# Patient Record
Sex: Male | Born: 1985
Health system: Southern US, Community
[De-identification: ages and names within clinical notes are randomized; demographics above are authoritative.]

## PROBLEM LIST (undated history)

## (undated) DIAGNOSIS — K219 Gastro-esophageal reflux disease without esophagitis: Secondary | ICD-10-CM

## (undated) DIAGNOSIS — B019 Varicella without complication: Secondary | ICD-10-CM

## (undated) DIAGNOSIS — G43909 Migraine, unspecified, not intractable, without status migrainosus: Secondary | ICD-10-CM

## (undated) DIAGNOSIS — K200 Eosinophilic esophagitis: Secondary | ICD-10-CM

## (undated) HISTORY — DX: Gastro-esophageal reflux disease without esophagitis: K21.9

## (undated) HISTORY — DX: Varicella without complication: B01.9

## (undated) HISTORY — DX: Migraine, unspecified, not intractable, without status migrainosus: G43.909

## (undated) HISTORY — DX: Eosinophilic esophagitis: K20.0

---

## 2015-09-07 ENCOUNTER — Ambulatory Visit (INDEPENDENT_AMBULATORY_CARE_PROVIDER_SITE_OTHER): Payer: BLUE CROSS/BLUE SHIELD | Admitting: Primary Care

## 2015-09-07 ENCOUNTER — Encounter: Payer: Self-pay | Admitting: Primary Care

## 2015-09-07 VITALS — BP 116/68 | HR 55 | Temp 98.3°F | Ht 68.5 in | Wt 155.8 lb

## 2015-09-07 DIAGNOSIS — Z0000 Encounter for general adult medical examination without abnormal findings: Secondary | ICD-10-CM | POA: Diagnosis not present

## 2015-09-07 DIAGNOSIS — Z789 Other specified health status: Secondary | ICD-10-CM

## 2015-09-07 DIAGNOSIS — Z23 Encounter for immunization: Secondary | ICD-10-CM

## 2015-09-07 NOTE — Assessment & Plan Note (Signed)
Tdap due, provided today. He follows a healthy lifestyle through diet and routine exercise. Recommended bi-annual eye exam. Exam unremarkable. Declines STD testing. No other labs required aside from varicella immunity confirmation. Will obtain TB skin testing in January 2018. Follow up in 1 year for repeat physical.

## 2015-09-07 NOTE — Progress Notes (Signed)
Subjective:    Patient ID: Steven Reid, male    DOB: [birth year], 30 y.o.   MRN: [id number]  HPI  Steven Reid is a 30 year old male who presents today to establish care and for complete physical.  Immunizations: -Tetanus: Last completed in 1992, due today. -Influenza: Did not complete last season.  Diet: He endorses a healthy diet. Breakfast: Oatmeal, hard boiled eggs Lunch: Lean protein, lentils, beans Dinner: Lean protein, vegetables, beans Snacks: Almonds, walnuts, cheese Desserts: Occasionally Beverages: Water  Exercise: He lifts weights and completes cardio 4-5 times weekly. Eye exam: Completed 3 years ago. No acute changes recently.  Dental exam: Completed semi-annually.     Review of Systems  Constitutional: Negative for unexpected weight change.  HENT: Negative for rhinorrhea.   Respiratory: Negative for cough and shortness of breath.   Cardiovascular: Negative for chest pain.  Gastrointestinal: Negative for diarrhea and constipation.  Genitourinary: Negative for difficulty urinating.  Musculoskeletal: Negative for myalgias and arthralgias.  Skin: Negative for rash.  Allergic/Immunologic: Negative for environmental allergies.  Neurological: Negative for dizziness, numbness and headaches.  Psychiatric/Behavioral:       Denies concerns for anxiety or depression       Past Medical History  Diagnosis Date  . Chicken pox   . Migraines      Social History   Social History  . Marital Status: Married    Spouse Name: N/A  . Number of Children: N/A  . Years of Education: N/A   Occupational History  . Not on file.   Social History Main Topics  . Smoking status: Never Smoker   . Smokeless tobacco: Not on file  . Alcohol Use: No  . Drug Use: No  . Sexual Activity: Not on file   Other Topics Concern  . Not on file   Social History Narrative   Married.   No children.   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   Bachelors degree in DentistCyber Crime.   Enjoys traveling, spending time at home, exercising.    No past surgical history on file.  Family History  Problem Relation Age of Onset  . Hypertension Father     No Known Allergies  No current outpatient prescriptions on file prior to visit.   No current facility-administered medications on file prior to visit.    BP 116/68 mmHg  Pulse 55  Temp(Src) 98.3 F (36.8 C) (Oral)  Ht 5' 8.5" (1.74 m)  Wt 155 lb 12.8 oz (70.67 kg)  BMI 23.34 kg/m2  SpO2 98%    Objective:   Physical Exam  Constitutional: He is oriented to person, place, and time. He appears well-nourished.  HENT:  Right Ear: Tympanic membrane and ear canal normal.  Left Ear: Tympanic membrane and ear canal normal.  Nose: Nose normal. Right sinus exhibits no maxillary sinus tenderness and no frontal sinus tenderness. Left sinus exhibits no maxillary sinus tenderness and no frontal sinus tenderness.  Mouth/Throat: Oropharynx is clear and moist.  Eyes: Conjunctivae and EOM are normal. Pupils are equal, round, and reactive to light.  Neck: Neck supple. Carotid bruit is not present. No thyromegaly present.  Cardiovascular: Normal rate, regular rhythm and normal heart sounds.   Pulmonary/Chest: Effort normal and breath sounds normal. He has no wheezes. He has no rales.  Abdominal: Soft. Bowel sounds are normal. There is no tenderness.  Musculoskeletal: Normal range of motion.  Neurological: He is alert and oriented to person, place, and time. He has normal reflexes. No cranial  nerve deficit.  Skin: Skin is warm and dry.  Psychiatric: He has a normal mood and affect.          Assessment & Plan:

## 2015-09-07 NOTE — Progress Notes (Signed)
Pre visit review using our clinic review tool, if applicable. No additional management support is needed unless otherwise documented below in the visit note. 

## 2015-09-07 NOTE — Patient Instructions (Addendum)
Complete lab work prior to leaving today. I will notify you of your results once received.   Return when you require a TB skin test for school. Just schedule a nurse only visit.  Continue your healthy lifestyle through healthy diet and exercise.   Follow up in 1 year for repeat physical or sooner if needed.  It was a pleasure to meet you today! Please don't hesitate to call me with any questions. Welcome to [facility]!

## 2015-09-07 NOTE — Addendum Note (Signed)
Addended by: Tawnya CrookSAMBATH, Merriel Zinger on: 09/07/2015 11:31 AM   Modules accepted: Orders

## 2015-09-08 ENCOUNTER — Encounter: Payer: Self-pay | Admitting: *Deleted

## 2015-09-08 ENCOUNTER — Telehealth: Payer: Self-pay | Admitting: Primary Care

## 2015-09-08 LAB — VARICELLA ZOSTER ANTIBODY, IGG: VARICELLA IGG: 495.9 {index} — AB (ref <135.00)

## 2015-09-08 NOTE — Telephone Encounter (Signed)
Spoken and notified patient of Kate's comments. Patient verbalized understanding. 

## 2015-09-08 NOTE — Telephone Encounter (Signed)
Pt returned your call.  

## 2016-04-19 ENCOUNTER — Other Ambulatory Visit (INDEPENDENT_AMBULATORY_CARE_PROVIDER_SITE_OTHER): Payer: BLUE CROSS/BLUE SHIELD

## 2016-04-19 ENCOUNTER — Ambulatory Visit: Payer: BLUE CROSS/BLUE SHIELD

## 2016-04-19 DIAGNOSIS — Z119 Encounter for screening for infectious and parasitic diseases, unspecified: Secondary | ICD-10-CM | POA: Diagnosis not present

## 2016-04-22 LAB — QUANTIFERON TB GOLD ASSAY (BLOOD)
INTERFERON GAMMA RELEASE ASSAY: NEGATIVE
MITOGEN-NIL SO: 8.67 [IU]/mL
QUANTIFERON NIL VALUE: 0.02 [IU]/mL
QUANTIFERON TB AG MINUS NIL: 0 [IU]/mL

## 2016-04-24 ENCOUNTER — Encounter: Payer: Self-pay | Admitting: *Deleted

## 2016-07-07 ENCOUNTER — Telehealth: Payer: Self-pay | Admitting: Primary Care

## 2016-07-07 NOTE — Telephone Encounter (Signed)
Patient walked in to drop off forms for Immunizations for School.  Placed in script tower.  Thank you,  -LL

## 2016-07-07 NOTE — Telephone Encounter (Signed)
Spoke to pt. Form filled out and ready for pickup. It was asking for a Flu vaccine from Sept to May. Advised him we did not have any. 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

## 2016-11-16 ENCOUNTER — Telehealth: Payer: Self-pay | Admitting: Primary Care

## 2016-11-16 DIAGNOSIS — Z0283 Encounter for blood-alcohol and blood-drug test: Secondary | ICD-10-CM

## 2016-11-16 NOTE — Telephone Encounter (Signed)
I'm not sure, Camelia Eng do you know if this is something we offer or will he need to go to an urgent care?

## 2016-11-16 NOTE — Telephone Encounter (Signed)
Caller Name:Steven Reid  Relationship to Patient:self Best number:[phone] Pharmacy:  Reason for call: pt needs a 5 panel drug screen for for school.  He did not get an order.  Is this something we offer? Also, he needs a flu shot.

## 2016-11-16 NOTE — Telephone Encounter (Signed)
Patient scheduled and order has been placed

## 2016-11-23 ENCOUNTER — Other Ambulatory Visit: Payer: BLUE CROSS/BLUE SHIELD

## 2016-11-23 ENCOUNTER — Ambulatory Visit (INDEPENDENT_AMBULATORY_CARE_PROVIDER_SITE_OTHER): Payer: BLUE CROSS/BLUE SHIELD

## 2016-11-23 DIAGNOSIS — Z23 Encounter for immunization: Secondary | ICD-10-CM | POA: Diagnosis not present

## 2016-11-23 DIAGNOSIS — Z0283 Encounter for blood-alcohol and blood-drug test: Secondary | ICD-10-CM

## 2016-11-24 ENCOUNTER — Encounter: Payer: Self-pay | Admitting: *Deleted

## 2016-11-24 LAB — DRUG SCREEN, 5 PANEL, UR
AMPHETAMINES, URINE: NEGATIVE ng/mL
CANNABINOID QUANT UR: NEGATIVE ng/mL
Cocaine (Metab.): NEGATIVE ng/mL
OPIATE QUANTITATIVE URINE: NEGATIVE ng/mL
PCP QUANT UR: NEGATIVE ng/mL

## 2017-07-23 ENCOUNTER — Encounter: Payer: Self-pay | Admitting: Primary Care

## 2017-07-23 ENCOUNTER — Ambulatory Visit: Payer: BLUE CROSS/BLUE SHIELD | Admitting: Primary Care

## 2017-07-23 ENCOUNTER — Ambulatory Visit (INDEPENDENT_AMBULATORY_CARE_PROVIDER_SITE_OTHER)
Admission: RE | Admit: 2017-07-23 | Discharge: 2017-07-23 | Disposition: A | Discharge status: Home / Self Care | Payer: BLUE CROSS/BLUE SHIELD | Source: Ambulatory Visit | Attending: Primary Care | Admitting: Primary Care

## 2017-07-23 VITALS — BP 106/66 | HR 62 | Temp 98.2°F | Ht 68.5 in | Wt 160.0 lb

## 2017-07-23 DIAGNOSIS — M545 Low back pain: Secondary | ICD-10-CM | POA: Diagnosis not present

## 2017-07-23 DIAGNOSIS — G8929 Other chronic pain: Secondary | ICD-10-CM

## 2017-07-23 NOTE — Progress Notes (Signed)
Subjective:    Patient ID: Steven Reid, male    DOB: 1985/10/12, 32 y.o.   MRN: [id number]  HPI  Steven Reid is a 32 year old male who presents today with a chief complaint of back pain. His pain is located to the right lower back with most pain being around PSIS joint region.    In 07-16-2012 was doing dead lifts in the gym, noticed a sudden popping noise with immediate muscle tension/tightness/pain to the right lower back. His symptoms were more severe during that time, did notice some improvement but over the years has not noticed resolve.  Pain is present when laying on his back and sitting for prolonged periods of time, especially when driving for more than 40 minutes in the car.   He saw a chiropractor in 07/17/2015, underwent imaging of the lumbar spine which showed shifting in L3-L5. He completed adjustments for one month, felt better but no resolve.  Symptoms now including feeling muscle tightness/pulling to the right lower back. He denies numbness/tingling, pain to lower extremities, trauma/injury, weakness. He exercises frequently, has worked on stretching and core strengthening without much improvement. He did undergo dry needling in the past which caused increased discomfort. He doesn't take anything OTC or RX for his symptoms.   Review of Systems  Musculoskeletal: Positive for back pain.  Skin: Negative for color change.  Neurological: Negative for weakness and numbness.       Past Medical History:  Diagnosis Date  . Chicken pox   . Migraines      Social History   Socioeconomic History  . Marital status: Married    Spouse name: Not on file  . Number of children: Not on file  . Years of education: Not on file  . Highest education level: Not on file  Occupational History  . Not on file  Social Needs  . Financial resource strain: Not on file  . Food insecurity:    Worry: Not on file    Inability: Not on file  . Transportation needs:    Medical: Not on file   Non-medical: Not on file  Tobacco Use  . Smoking status: Never Smoker  . Smokeless tobacco: Never Used  Substance and Sexual Activity  . Alcohol use: No    Alcohol/week: 0.0 oz  . Drug use: No  . Sexual activity: Not on file  Lifestyle  . Physical activity:    Days per week: Not on file    Minutes per session: Not on file  . Stress: Not on file  Relationships  . Social connections:    Talks on phone: Not on file    Gets together: Not on file    Attends religious service: Not on file    Active member of club or organization: Not on file    Attends meetings of clubs or organizations: Not on file    Relationship status: Not on file  . Intimate partner violence:    Fear of current or ex partner: Not on file    Emotionally abused: Not on file    Physically abused: Not on file    Forced sexual activity: Not on file  Other Topics Concern  . Not on file  Social History Narrative   Married.   No children.   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   Bachelors degree in Dentist.   Enjoys traveling, spending time at home, exercising.     Family History  Problem Relation Age of Onset  .  Hypertension Father     No Known Allergies  No current outpatient medications on file prior to visit.   No current facility-administered medications on file prior to visit.     BP 106/66   Pulse 62   Temp 98.2 F (36.8 C) (Oral)   Ht 5' 8.5" (1.74 m)   Wt 160 lb (72.6 kg)   SpO2 98%   BMI 23.97 kg/m    Objective:   Physical Exam  Constitutional: He appears well-nourished.  Cardiovascular: Normal rate and regular rhythm.  Respiratory: Effort normal and breath sounds normal.  Musculoskeletal:       Lumbar back: He exhibits tenderness and pain. He exhibits normal range of motion, no bony tenderness and no deformity.       Back:  Negative straight leg raise bilaterally. Tenderness to right lower PSIS region with moderate palpation. 5/5 strength to bilateral lower extremities.     Skin: Skin is warm and dry.           Assessment & Plan:

## 2017-07-23 NOTE — Assessment & Plan Note (Signed)
Chronic to right lower back since 2014, no complete resolve. Exam today overall without abnormality, suspect MSK involvement.Steven Reid. 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  Will start with plain films of the lumbar spine. Will also send to sports medicine for further evaluation. No alarm signs.

## 2017-07-23 NOTE — Patient Instructions (Signed)
Complete xray(s) prior to leaving today. I will notify you of your results once received.  Continue regular stretching and exercise as discussed.  Schedule an appointment with Dr. Patsy Lageropland at your convenience.  It was a pleasure to see you today!

## 2017-07-26 ENCOUNTER — Ambulatory Visit: Payer: BLUE CROSS/BLUE SHIELD | Admitting: Family Medicine

## 2017-07-26 ENCOUNTER — Encounter: Payer: Self-pay | Admitting: Family Medicine

## 2017-07-26 VITALS — BP 110/62 | HR 66 | Temp 98.6°F | Ht 68.5 in | Wt 161.8 lb

## 2017-07-26 DIAGNOSIS — M533 Sacrococcygeal disorders, not elsewhere classified: Secondary | ICD-10-CM | POA: Diagnosis not present

## 2017-07-26 NOTE — Progress Notes (Signed)
Dr. Karleen Hampshire T. Darcey Demma, MD, CAQ Sports Medicine Primary Care and Sports Medicine [address] [place], [address] Phone: [phone] Fax: [phone]  07/26/2017  Patient: Steven Reid, MRN: [id number], DOB: 1985-07-22, 32 y.o.  Primary Physician:  Doreene Nest, NP   Chief Complaint  Patient presents with  . Back Pain   Subjective:   Steven Reid is a 32 y.o. very pleasant male patient who presents with the following:  Pop low back when lowering some deadlift weight in 2014.  He felt this and heard this and it was localized into the region of his SI joint on the right.  He has been having some pain off and on with this over the last for 5 years.  He has tried to alter his program at the gym.  He no longer does back squats or dead lifts.  He is able to front squat without difficulty.  He is not doing any Olympic lifts.  He did try to do some hang cleans, but this is bothered it quite a bit.  Never had any back pain before then. Now everything is super tight and sore. Sometimes will use a tiger balm. No meds. 5-6 years later. Laying down, 20 mins will start to hurt. Sitting is the worst.   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   Recently, he has been doing some SI maneuvers that he learned in school, and he thinks that this is actually been making a difference.  Past Medical History, Surgical History, Social History, Family History, Problem List, Medications, and Allergies have been reviewed and updated if relevant.  Patient Active Problem List   Diagnosis Date Noted  . Chronic right-sided low back pain without sciatica 07/23/2017  . Preventative health care 09/07/2015    Past Medical History:  Diagnosis Date  . Chicken pox   . Migraines     History reviewed. No pertinent surgical history.  Social History   Socioeconomic History  . Marital status: Married    Spouse name: Not on file  . Number of children: Not on file  . Years of education: Not on file  . Highest  education level: Not on file  Occupational History  . Not on file  Social Needs  . Financial resource strain: Not on file  . Food insecurity:    Worry: Not on file    Inability: Not on file  . Transportation needs:    Medical: Not on file    Non-medical: Not on file  Tobacco Use  . Smoking status: Never Smoker  . Smokeless tobacco: Never Used  Substance and Sexual Activity  . Alcohol use: No    Alcohol/week: 0.0 oz  . Drug use: No  . Sexual activity: Not on file  Lifestyle  . Physical activity:    Days per week: Not on file    Minutes per session: Not on file  . Stress: Not on file  Relationships  . Social connections:    Talks on phone: Not on file    Gets together: Not on file    Attends religious service: Not on file    Active member of club or organization: Not on file    Attends meetings of clubs or organizations: Not on file    Relationship status: Not on file  . Intimate partner violence:    Fear of current or ex partner: Not on file    Emotionally abused: Not on file    Physically abused: Not on file  Forced sexual activity: Not on file  Other Topics Concern  . Not on file  Social History Narrative   Married.   No children.   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   Bachelors degree in DentistCyber Crime.   Enjoys traveling, spending time at home, exercising.    Family History  Problem Relation Age of Onset  . Hypertension Father     No Known Allergies  Medication list reviewed and updated in full in [facility].  GEN: No fevers, chills. Nontoxic. Primarily MSK c/o today. MSK: Detailed in the HPI GI: tolerating PO intake without difficulty Neuro: No numbness, parasthesias, or tingling associated. Otherwise the pertinent positives of the ROS are noted above.   Objective:   BP 110/62   Pulse 66   Temp 98.6 F (37 C) (Oral)   Ht 5' 8.5" (1.74 m)   Wt 161 lb 12 oz (73.4 kg)   BMI 24.24 kg/m    GEN: Well-developed,well-nourished,in no acute  distress; alert,appropriate and cooperative throughout examination HEENT: Normocephalic and atraumatic without obvious abnormalities. Ears, externally no deformities PULM: Breathing comfortably in no respiratory distress EXT: No clubbing, cyanosis, or edema PSYCH: Normally interactive. Cooperative during the interview. Pleasant. Friendly and conversant. Not anxious or depressed appearing. Normal, full affect.  Range of motion at  the waist: Flexion, extension, lateral bending and rotation: Grossly full range of motion with some modest discomfort with extension.  No echymosis or edema Rises to examination table with mild difficulty Gait: minimally antalgic  Inspection/Deformity: N Paraspinus Tenderness: Minimal to none.  B Ankle Dorsiflexion (L5,4): 5/5 B Great Toe Dorsiflexion (L5,4): 5/5 Heel Walk (L5): WNL Toe Walk (S1): WNL Rise/Squat (L4): WNL, mild pain  SENSORY B Medial Foot (L4): WNL B Dorsum (L5): WNL B Lateral (S1): WNL Light Touch: WNL Pinprick: WNL  REFLEXES Knee (L4): 2+ Ankle (S1): 2+  B SLR, seated: neg B SLR, supine: neg B FABER: neg B Reverse FABER: neg B Greater Troch: NT B Log Roll: neg B Stork: NT B Sciatic Notch: TTP on the R  Radiology: Dg Lumbar Spine Complete  Result Date: 07/23/2017 CLINICAL DATA:  Chronic back pain since 2014, increased pain with sitting and lying down, no radiculopathy, no history of trauma EXAM: LUMBAR SPINE - COMPLETE 4+ VIEW COMPARISON:  None FINDINGS: Osseous mineralization normal for technique. Five non-rib-bearing lumbar vertebra. Vertebral body and disc space heights maintained. No fracture, subluxation, or bone destruction. No spondylolysis. SI joints preserved. IMPRESSION: Normal exam. Electronically Signed   By: Ulyses SouthwardMark  Boles M.D.   On: 07/23/2017 15:24    Assessment and Plan:   SI (sacroiliac) joint dysfunction  >25 minutes spent in face to face time with patient, >50% spent in counselling or coordination of care    Isolates to R SI joint, and historically sounds as if a R SI joint injury.  A rehabilitation program was given to the patient emphasizing [facility], increasing overall hip and piriformis flexibility, and hip flexor and abductor strength.  I think that he is already doing a good job with his rehab with what he has changed recently.  I think he should basically only limit himself in the gym based on pain.  Part of this was given from P/I section and another handout custom made for patient.  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   This condition does  remarkably well in the medical literature with manipulation, so chiropractic maneuvers would be a very reasonable idea in this case.  Follow-up: prn only  Signed,  Granger Chui T. Sharonica Kraszewski, MD   Allergies as of 07/26/2017   No Known Allergies     Medication List    as of 07/26/2017 11:59 PM   You have not been prescribed any medications.

## 2017-07-26 NOTE — Patient Instructions (Signed)
Sacroiliac Joint Mobilization and Rehab 1. Work on pretzel stretching, shoulder back and leg draped in front. 3-5 sets, 30 sec.. 2. hip abductor rotations. standing, hip flexion and rotation outward then inward. 3 sets, 15 reps. when can do comfortably, add ankle weights starting at 2 pounds.  3. cross over stretching - shoulder back to ground, same side leg crossover. 3-5 sets for 30 min..  4. rolling up and back knees to chest and rocking. 5. sacral tilt - 5 sets, hold for 5-10 seconds  6. Sink stretch

## 2017-11-15 ENCOUNTER — Encounter: Payer: Self-pay | Admitting: Family Medicine

## 2017-11-15 ENCOUNTER — Ambulatory Visit: Payer: BLUE CROSS/BLUE SHIELD | Admitting: Family Medicine

## 2017-11-15 VITALS — BP 100/64 | HR 83 | Temp 98.9°F | Ht 68.5 in | Wt 160.2 lb

## 2017-11-15 DIAGNOSIS — B349 Viral infection, unspecified: Secondary | ICD-10-CM | POA: Diagnosis not present

## 2017-11-15 NOTE — Progress Notes (Signed)
Dr. Karleen Hampshire T. Jonh Mcqueary, MD, CAQ Sports Medicine Primary Care and Sports Medicine [address] [place], [address] Phone: [phone] Fax: [phone]  11/15/2017  Patient: Steven Reid, MRN: [id number], DOB: 12-28-1985, 32 y.o.  Primary Physician:  Doreene Nest, NP   Chief Complaint  Patient presents with  . Cough    with chest congestion x 1 week-getting worse  . Nasal Congestion   Subjective:   This 32 y.o. male patient presents with runny nose, sneezing, cough, malaise and minimal / low-grade fever .  Deep cough x 7 d, non-smoker.   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   The patent denies sore throat as the primary complaint. Denies sthortness of breath/wheezing, high fever, chest pain, rhinits for more than 14 days, significant myalgia, otalgia, facial pain, abdominal pain, changes in bowel or bladder.  PMH, PHS, Allergies, Problem List, Medications, Family History, and Social History have all been reviewed.  Patient Active Problem List   Diagnosis Date Noted  . Chronic right-sided low back pain without sciatica 07/23/2017  . Preventative health care 09/07/2015    Past Medical History:  Diagnosis Date  . Chicken pox   . Migraines     History reviewed. No pertinent surgical history.  Social History   Socioeconomic History  . Marital status: Married    Spouse name: Not on file  . Number of children: Not on file  . Years of education: Not on file  . Highest education level: Not on file  Occupational History  . Not on file  Social Needs  . Financial resource strain: Not on file  . Food insecurity:    Worry: Not on file    Inability: Not on file  . Transportation needs:    Medical: Not on file    Non-medical: Not on file  Tobacco Use  . Smoking status: Never Smoker  . Smokeless tobacco: Never Used  Substance and Sexual Activity  . Alcohol use: No    Alcohol/week: 0.0 standard drinks  . Drug use: No  .  Sexual activity: Not on file  Lifestyle  . Physical activity:    Days per week: Not on file    Minutes per session: Not on file  . Stress: Not on file  Relationships  . Social connections:    Talks on phone: Not on file    Gets together: Not on file    Attends religious service: Not on file    Active member of club or organization: Not on file    Attends meetings of clubs or organizations: Not on file    Relationship status: Not on file  . Intimate partner violence:    Fear of current or ex partner: Not on file    Emotionally abused: Not on file    Physically abused: Not on file    Forced sexual activity: Not on file  Other Topics Concern  . Not on file  Social History Narrative   Married.   No children.   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   Bachelors degree in Dentist.   Enjoys traveling, spending time at home, exercising.    Family History  Problem Relation Age of Onset  . Hypertension Father     No Known Allergies  Medication list reviewed and updated in full in [facility].  ROS as above, eating and drinking - tolerating PO. Urinating normally. No excessive vomitting or diarrhea. O/w as above.  Objective:   Blood pressure 100/64, pulse 83, temperature 98.9 F (37.2 C), temperature source Oral, height 5' 8.5" (1.74 m), weight 160 lb 4 oz (72.7 kg), SpO2 96 %.  GEN: WDWN, Non-toxic, Atraumatic, normocephalic. A and O x 3. HEENT: Oropharynx clear without exudate, MMM, no significant LAD, mild rhinnorhea Ears: TM clear, COL visualized with good landmarks CV: RRR, no m/g/r. Pulm: CTA B, no wheezes, rhonchi, or crackles, normal respiratory effort. EXT: no c/c/e Psych: well oriented, neither depressed nor anxious in appearance  Objective Data:  Assessment and Plan:   Viral syndrome  Supportive care reviewed with patient. Looks good. Reassurance. OTC cough meds prn  Follow-up: No follow-ups on file.   Signed,  Elpidio Galea. Olanna Percifield,  MD   Patient's Medications   No medications on file

## 2017-12-18 ENCOUNTER — Ambulatory Visit: Payer: BLUE CROSS/BLUE SHIELD | Admitting: Primary Care

## 2017-12-18 ENCOUNTER — Ambulatory Visit (INDEPENDENT_AMBULATORY_CARE_PROVIDER_SITE_OTHER)
Admission: RE | Admit: 2017-12-18 | Discharge: 2017-12-18 | Disposition: A | Discharge status: Home / Self Care | Payer: BLUE CROSS/BLUE SHIELD | Source: Ambulatory Visit | Attending: Primary Care | Admitting: Primary Care

## 2017-12-18 ENCOUNTER — Encounter: Payer: Self-pay | Admitting: Primary Care

## 2017-12-18 VITALS — BP 112/78 | HR 51 | Temp 98.4°F | Resp 18 | Ht 68.5 in | Wt 162.0 lb

## 2017-12-18 DIAGNOSIS — R05 Cough: Secondary | ICD-10-CM

## 2017-12-18 DIAGNOSIS — R059 Cough, unspecified: Secondary | ICD-10-CM

## 2017-12-18 MED ORDER — DOXYCYCLINE HYCLATE 100 MG PO TABS: 100.0000 mg | ORAL_TABLET | Freq: Two times a day (BID) | ORAL | 0 refills | Status: DC

## 2017-12-18 MED ORDER — BENZONATATE 200 MG PO CAPS: 200.0000 mg | ORAL_CAPSULE | Freq: Three times a day (TID) | ORAL | 0 refills | Status: DC | PRN

## 2017-12-18 NOTE — Progress Notes (Signed)
Subjective:    Patient ID: Steven Reid, male    DOB: 1986/01/30, 32 y.o.   MRN: [id number]  HPI  Mr. Ponti is a 32 year old male who presents today with a chief complaint of rib pain and cough.  He was last evaluated on 11/15/17 by Dr. Patsy Lager with a one week history of cough, sneezing, rhinorrhea, low grade fevers. 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 He was diagnosed with viral syndrome and provided with supportive care.   Since his last visit he's noticed left anterior-lateral rib pain just below the pectoris. He continues to experience productive cough. Overall his symptoms have waxed and waned, some days he'll feel better and other days he feels worse. He has not recovered.  He's not taking anything OTC for his cough within the last several weeks.   Review of Systems  Constitutional: Positive for fatigue. Negative for fever.  HENT: Positive for congestion. Negative for ear pain and sinus pressure.   Respiratory: Positive for cough. Negative for shortness of breath.   Musculoskeletal:       Chest wall pain       Past Medical History:  Diagnosis Date  . Chicken pox   . Migraines      Social History   Socioeconomic History  . Marital status: Married    Spouse name: Not on file  . Number of children: Not on file  . Years of education: Not on file  . Highest education level: Not on file  Occupational History  . Not on file  Social Needs  . Financial resource strain: Not on file  . Food insecurity:    Worry: Not on file    Inability: Not on file  . Transportation needs:    Medical: Not on file    Non-medical: Not on file  Tobacco Use  . Smoking status: Never Smoker  . Smokeless tobacco: Never Used  Substance and Sexual Activity  . Alcohol use: No    Alcohol/week: 0.0 standard drinks  . Drug use: No  . Sexual activity: Not on file  Lifestyle  . Physical activity:    Days per week: Not on file    Minutes per session: Not on  file  . Stress: Not on file  Relationships  . Social connections:    Talks on phone: Not on file    Gets together: Not on file    Attends religious service: Not on file    Active member of club or organization: Not on file    Attends meetings of clubs or organizations: Not on file    Relationship status: Not on file  . Intimate partner violence:    Fear of current or ex partner: Not on file    Emotionally abused: Not on file    Physically abused: Not on file    Forced sexual activity: Not on file  Other Topics Concern  . Not on file  Social History Narrative   Married.   No children.   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   Bachelors degree in Dentist.   Enjoys traveling, spending time at home, exercising.    No past surgical history on file.  Family History  Problem Relation Age of Onset  . Hypertension Father     No Known Allergies  No current outpatient medications on file prior to visit.   No current facility-administered medications on file prior to visit.     BP 112/78 (BP  Location: Left Arm, Patient Position: Sitting, Cuff Size: Normal)   Pulse (!) 51   Temp 98.4 F (36.9 C) (Oral)   Resp 18   Ht 5' 8.5" (1.74 m)   Wt 162 lb (73.5 kg)   SpO2 99%   BMI 24.27 kg/m    Objective:   Physical Exam  Constitutional: He appears well-nourished. He does not appear ill.  HENT:  Right Ear: Tympanic membrane and ear canal normal.  Left Ear: Tympanic membrane and ear canal normal.  Nose: No mucosal edema. Right sinus exhibits no maxillary sinus tenderness and no frontal sinus tenderness. Left sinus exhibits no maxillary sinus tenderness and no frontal sinus tenderness.  Mouth/Throat: Oropharynx is clear and moist.  Neck: Neck supple.  Cardiovascular: Normal rate and regular rhythm.  Respiratory: Effort normal and breath sounds normal. He has no wheezes.  Mildly congested cough during exam. Slightly diminished sounds to right lower lobe.  Skin: Skin is warm and  dry.           Assessment & Plan:  Acute Bronchitis:  Cough, congestion, fevers, fatigue x 5-6 weeks.  Exam today with some evidence of diminshed sounds to right lower fields. Chest xray pending to rule out CAP. Suspect chest wall pain secondary to inflammation from coughing, discussed treatment with Ibuprofen.  Given lack of improvement for this otherwise healthy young man, and also based off of presentation and exam, will treat for presumed bacterial cause. Rx for Doxycycline 7 day course and benzonatate capsule sent to pharmacy. Fluids, rest, follow up PRN.  Doreene Nest, NP

## 2017-12-18 NOTE — Patient Instructions (Addendum)
Start Doxycycline antibiotic for the infection. Take 1 tablet by mouth twice daily for 7 days.  You may take Benzonatate capsules for cough. Take 1 capsule by mouth three times daily as needed for cough.  Complete xray(s) prior to leaving today. I will notify you of your results once received.  Make sure to drink plenty of fluids and rest.   It was a pleasure to see you today!

## 2017-12-21 ENCOUNTER — Telehealth: Payer: Self-pay | Admitting: Primary Care

## 2017-12-21 NOTE — Telephone Encounter (Signed)
Per DPR, left detail message of Steven Reid comments for patient regarding x-ray

## 2017-12-21 NOTE — Telephone Encounter (Signed)
Pt want to know results of his xray he had done on 10.29.19. Please advise

## 2018-01-22 ENCOUNTER — Encounter: Payer: Self-pay | Admitting: Internal Medicine

## 2018-01-22 ENCOUNTER — Ambulatory Visit: Payer: BLUE CROSS/BLUE SHIELD | Admitting: Internal Medicine

## 2018-01-22 VITALS — BP 110/72 | HR 54 | Temp 98.0°F | Wt 170.0 lb

## 2018-01-22 DIAGNOSIS — J3089 Other allergic rhinitis: Secondary | ICD-10-CM

## 2018-01-22 NOTE — Patient Instructions (Signed)

## 2018-01-22 NOTE — Progress Notes (Signed)
HPI  Pt presents to the clinic today with c/o facial pressure, nasal congestion, sore throat and cough. He reports this started 2 days ago. He is blowing green mucous out of his nose. He denies difficulty swallowing. The cough is productive of green mucous. He denies fever, chills or body aches. He has tried Flonase with some relief. He has no history of allergies. He has had sick contacts.  Review of Systems     Past Medical History:  Diagnosis Date  . Chicken pox   . Migraines     Family History  Problem Relation Age of Onset  . Hypertension Father     Social History   Socioeconomic History  . Marital status: Married    Spouse name: Not on file  . Number of children: Not on file  . Years of education: Not on file  . Highest education level: Not on file  Occupational History  . Not on file  Social Needs  . Financial resource strain: Not on file  . Food insecurity:    Worry: Not on file    Inability: Not on file  . Transportation needs:    Medical: Not on file    Non-medical: Not on file  Tobacco Use  . Smoking status: Never Smoker  . Smokeless tobacco: Never Used  Substance and Sexual Activity  . Alcohol use: No    Alcohol/week: 0.0 standard drinks  . Drug use: No  . Sexual activity: Not on file  Lifestyle  . Physical activity:    Days per week: Not on file    Minutes per session: Not on file  . Stress: Not on file  Relationships  . Social connections:    Talks on phone: Not on file    Gets together: Not on file    Attends religious service: Not on file    Active member of club or organization: Not on file    Attends meetings of clubs or organizations: Not on file    Relationship status: Not on file  . Intimate partner violence:    Fear of current or ex partner: Not on file    Emotionally abused: Not on file    Physically abused: Not on file    Forced sexual activity: Not on file  Other Topics Concern  . Not on file  Social History Narrative   Married.   No children.   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   Bachelors degree in Dentist.   Enjoys traveling, spending time at home, exercising.    No Known Allergies   Constitutional: Denies headache, fatigue, fever or abrupt weight changes.  HEENT:  Positive facial pain, nasal congestion and sore throat. Denies eye redness, ear pain, ringing in the ears, wax buildup, runny nose or bloody nose. Respiratory: Positive cough. Denies difficulty breathing or shortness of breath.  Cardiovascular: Denies chest pain, chest tightness, palpitations or swelling in the hands or feet.   No other specific complaints in a complete review of systems (except as listed in HPI above).  Objective:   BP 110/72   Pulse (!) 54   Temp 98 F (36.7 C) (Oral)   Wt 170 lb (77.1 kg)   SpO2 98%   BMI 25.47 kg/m   General: Appears his stated age, well developed, well nourished in NAD. HEENT: Head: normal shape and size, no sinus tenderness noted; Ears: Tm's gray and intact, normal light reflex; Nose: mucosa boggy and moist, turbinates swollen; Throat/Mouth: + PND. Teeth present, mucosa  erythematous and moist, no exudate noted, no lesions or ulcerations noted.  Neck:  No adenopathy noted.  Cardiovascular: Normal rate and rhythm. S1,S2 noted.  No murmur, rubs or gallops noted.  Pulmonary/Chest: Normal effort and positive vesicular breath sounds. No respiratory distress. No wheezes, rales or ronchi noted.       Assessment & Plan:   Allergic Rhinitis  Flonase 2 sprays each nostril for 3 days and then daily as needed. Zyrtec 10 mg OTC daily x 1 week  RTC as needed or if symptoms persist. Nicki Reaperegina Therman Hughlett, NP

## 2018-02-28 ENCOUNTER — Telehealth: Payer: Self-pay | Admitting: Primary Care

## 2018-02-28 NOTE — Telephone Encounter (Signed)
Spoke to pt. He will look for a provider.

## 2018-02-28 NOTE — Telephone Encounter (Signed)
Pt called office to see if he can get fertility testing done through [facility]. Can orders be sent to [facility] to get it done or would he need to be referred out. Please advise

## 2018-02-28 NOTE — Telephone Encounter (Signed)
Please notify patient that we don't have experience with fertility testing, this is typically done through a Urologist (sperm evaluation) or fertility clinic.

## 2018-03-19 DIAGNOSIS — N469 Male infertility, unspecified: Secondary | ICD-10-CM | POA: Diagnosis not present

## 2018-07-23 DIAGNOSIS — R208 Other disturbances of skin sensation: Secondary | ICD-10-CM | POA: Diagnosis not present

## 2018-07-23 DIAGNOSIS — L7211 Pilar cyst: Secondary | ICD-10-CM | POA: Diagnosis not present

## 2018-08-15 DIAGNOSIS — J3489 Other specified disorders of nose and nasal sinuses: Secondary | ICD-10-CM | POA: Diagnosis not present

## 2018-08-15 DIAGNOSIS — J301 Allergic rhinitis due to pollen: Secondary | ICD-10-CM | POA: Diagnosis not present

## 2018-08-15 DIAGNOSIS — R1314 Dysphagia, pharyngoesophageal phase: Secondary | ICD-10-CM | POA: Diagnosis not present

## 2018-08-15 DIAGNOSIS — J305 Allergic rhinitis due to food: Secondary | ICD-10-CM | POA: Diagnosis not present

## 2018-08-21 ENCOUNTER — Ambulatory Visit (INDEPENDENT_AMBULATORY_CARE_PROVIDER_SITE_OTHER): Payer: BC Managed Care – PPO | Admitting: Podiatry

## 2018-08-21 ENCOUNTER — Encounter: Payer: Self-pay | Admitting: Podiatry

## 2018-08-21 ENCOUNTER — Other Ambulatory Visit: Payer: Self-pay

## 2018-08-21 VITALS — Temp 97.7°F

## 2018-08-21 DIAGNOSIS — B351 Tinea unguium: Secondary | ICD-10-CM | POA: Diagnosis not present

## 2018-08-21 DIAGNOSIS — L601 Onycholysis: Secondary | ICD-10-CM | POA: Diagnosis not present

## 2018-08-21 DIAGNOSIS — L603 Nail dystrophy: Secondary | ICD-10-CM

## 2018-08-21 NOTE — Progress Notes (Signed)
  Subjective:  Patient ID: Steven Reid, male    DOB: [birth year],  MRN: [id number] HPI Chief Complaint  Patient presents with  . Nail Problem    i have some thick toenails on both big toes     33 y.o. male presents with the above complaint.   ROS: Denies fever chills nausea vomiting muscle aches pains calf pain back pain chest pain shortness of breath.  Past Medical History:  Diagnosis Date  . Chicken pox   . Migraines    History reviewed. No pertinent surgical history. No current outpatient medications on file.  No Known Allergies Review of Systems Objective:   Vitals:   08/21/18 1024  Temp: 97.7 F (36.5 C)    General: Well developed, nourished, in no acute distress, alert and oriented x3   Dermatological: Skin is warm, dry and supple bilateral. Nails x 10 are well maintained; remaining integument appears unremarkable at this time. There are no open sores, no preulcerative lesions, no rash or signs of infection present.  Hallux nails bilateral are thickened discolored with subungual debris and splitting.  Vascular: Dorsalis Pedis artery and Posterior Tibial artery pedal pulses are 2/4 bilateral with immedate capillary fill time. Pedal hair growth present. No varicosities and no lower extremity edema present bilateral.   Neruologic: Grossly intact via light touch bilateral. Vibratory intact via tuning fork bilateral. Protective threshold with Semmes Wienstein monofilament intact to all pedal sites bilateral. Patellar and Achilles deep tendon reflexes 2+ bilateral. No Babinski or clonus noted bilateral.   Musculoskeletal: No gross boney pedal deformities bilateral. No pain, crepitus, or limitation noted with foot and ankle range of motion bilateral. Muscular strength 5/5 in all groups tested bilateral.  Gait: Unassisted, Nonantalgic.    Radiographs:  None taken  Assessment & Plan:   Assessment: Nail dystrophy hallux bilateral.  Plan: Took skin and nail sample to be  sent for pathologic evaluation notify him of the results.     Elleigh Cassetta T. [place], [place]

## 2018-08-22 ENCOUNTER — Other Ambulatory Visit: Payer: Self-pay | Admitting: Unknown Physician Specialty

## 2018-08-22 DIAGNOSIS — R1312 Dysphagia, oropharyngeal phase: Secondary | ICD-10-CM

## 2018-09-11 NOTE — Progress Notes (Signed)
Patient has appt for follow up already to discuss results and treatment

## 2018-09-24 ENCOUNTER — Ambulatory Visit
Admission: RE | Admit: 2018-09-24 | Discharge: 2018-09-24 | Disposition: A | Discharge status: Home / Self Care | Payer: BC Managed Care – PPO | Source: Ambulatory Visit | Attending: Unknown Physician Specialty | Admitting: Unknown Physician Specialty

## 2018-09-24 ENCOUNTER — Other Ambulatory Visit: Payer: Self-pay

## 2018-09-24 DIAGNOSIS — R1310 Dysphagia, unspecified: Secondary | ICD-10-CM | POA: Insufficient documentation

## 2018-09-24 DIAGNOSIS — R1312 Dysphagia, oropharyngeal phase: Secondary | ICD-10-CM

## 2018-09-24 DIAGNOSIS — R1319 Other dysphagia: Secondary | ICD-10-CM

## 2018-09-24 NOTE — Therapy (Addendum)
[place], [place], [address] Phone: [phone]   Fax:     Modified Barium Swallow  Patient Details  Name: Steven Reid MRN: [id number] Date of Birth: June 10, 1985 No data recorded  Encounter Date: 09/24/2018  End of Session - 09/24/18 1452    Visit Number  1    Number of Visits  1    Date for SLP Re-Evaluation  09/24/18    SLP Start Time  74    SLP Stop Time   1400    SLP Time Calculation (min)  60 min    Activity Tolerance  Patient tolerated treatment well       Past Medical History:  Diagnosis Date  . Chicken pox   . Migraines     No past surgical history on file.  There were no vitals filed for this visit.       Subjective: Patient behavior: (alertness, ability to follow instructions, etc.): pt reported periodic episodes of food not completing clearing the (described) pharyngoesophageal area - a recent occurrence in May 2020. He described "tension" when swallowing at times. He endorsed behaviors of eating and drinking using LARGE bites/sips quickly; periodic episodes of s/s of Reflux. He endorsed current Stress in personal/work life; also grinding of teeth during sleep(bite guard use). Pt denies any Neurological history; medical dxs other than listed. Chief complaint: dysphagia.  No recent weight loss; no use of etoh; OM exam WFL; natural Dentition. No change in diet. He is not followed by a GI; not on a PPI.   Objective:  Radiological Procedure: A videoflouroscopic evaluation of oral-preparatory, reflex initiation, and pharyngeal phases of the swallow was performed; as well as a screening of the upper esophageal phase.  I. POSTURE: upright  II. VIEW: lateral  III. COMPENSATORY STRATEGIES: none indicated IV. BOLUSES ADMINISTERED:  Thin Liquid: 5 trials(1 very large)  Nectar-thick Liquid: 1 trial  Honey-thick Liquid: NT  Puree: 3 trials  Mechanical Soft: 2 trials V. RESULTS OF  EVALUATION: A. ORAL PREPARATORY PHASE: (The lips, tongue, and velum are observed for strength and coordination)       **Overall Severity Rating: WFL.   B. SWALLOW INITIATION/REFLEX: (The reflex is normal if "triggered" by the time the bolus reached the base of the tongue)  **Overall Severity Rating: [facility].  C. PHARYNGEAL PHASE: (Pharyngeal function is normal if the bolus shows rapid, smooth, and continuous transit through the pharynx and there is no pharyngeal residue after the swallow)  **Overall Severity Rating: [facility].   D. LARYNGEAL PENETRATION: (Material entering into the laryngeal inlet/vestibule but not aspirated): NONE E. ASPIRATION: NONE F. ESOPHAGEAL PHASE: (Screening of the upper esophagus): no dysmotility noted in the Cervical Esophagus   ASSESSMENT: Pt appears to present w/ NO oropharyngeal phase dysphagia as evidenced by this study today. He is at decreased risk for aspiration from an oropharyngeal phase standpoint. Pt does endorse s/s of REFLUX and practices eating/drinking behaviors that could promote such. He also endorses current Stress in personal/work life. This was discussed w/ him; general education on Reflux and its impact on swallowing was given. Encouraged pt to further investigate s/s of Reflux, make Behavioral changes when eating/drinking, and f/u w/ his PCP for management of Reflux as indicated.  Oral phase of swallowing was [facility]. Timely bolus management and mastication noted; timely A-P transfer and full oral clearing noted post swallow. During the pharyngeal phase, timely pharyngeal swallow initiation noted w/ NO aspiration or laryngeal penetration occurring w/ trials  during the study. Adequate laryngeal excursion and pharyngeal pressure during the swallow was apparent w/ no pharyngeal residue remaining post swallowing. During the Esophageal phase, no dysmotility or bolus stasis was noted in the Cervical Esophagus. Recommended full scanning and assessment of the Esophagus be  completed w/ a Barium Study if warranted by PCP, pt.  Education and discussion as well as viewing of the MBSS was completed w/ pt post study. Questions answered. No further skilled ST services f/u indicated.   PLAN/RECOMMENDATIONS:  A. Diet: regular diet w/ thin liquids  B. Swallowing Precautions: general aspiration precautions; REFLUX precautions  C. Recommended consultation to: f/u w/ PCP and GI for further assessment, management of Reflux  D. Therapy recommendations: NONE  E. Results and recommendations were discussed w/ pt; pt verbally agreed.            1. Esophageal dysphagia   2. Oropharyngeal dysphagia           Problem List Patient Active Problem List   Diagnosis Date Noted  . Chronic right-sided low back pain without sciatica 07/23/2017  . Preventative health care 09/07/2015        Jerilynn SomKatherine Watson, MS, CCC-SLP Watson,Katherine 09/24/2018, 2:54 PM  [facility] [facility] [address] [place], [place], [address] Phone: [phone]   Fax:     Name: Steven Reid MRN: [id number] Date of Birth: [birth year]

## 2018-09-25 ENCOUNTER — Encounter: Payer: Self-pay | Admitting: Podiatry

## 2018-09-25 ENCOUNTER — Ambulatory Visit (INDEPENDENT_AMBULATORY_CARE_PROVIDER_SITE_OTHER): Payer: BC Managed Care – PPO | Admitting: Podiatry

## 2018-09-25 ENCOUNTER — Ambulatory Visit: Payer: BC Managed Care – PPO | Admitting: Podiatry

## 2018-09-25 DIAGNOSIS — Z79899 Other long term (current) drug therapy: Secondary | ICD-10-CM

## 2018-09-25 DIAGNOSIS — L603 Nail dystrophy: Secondary | ICD-10-CM

## 2018-09-25 NOTE — Progress Notes (Signed)
He presents today for follow-up of his Baker report.  States that the toenails are unchanged.  Objective: No change in physical exam.  Pathology report does demonstrate positive onychomycosis.  Assessment: Onychomycosis.  Plan: Discussed the pros and cons of topical therapy laser therapy and oral therapy.  He would like to try laser therapy but is unsure that he will be here for a long enough time to complete the duration of therapy necessary.  He understands this is amenable to it we will follow-up with [place] on a as needed basis for laser therapy for his toenails.

## 2018-09-30 ENCOUNTER — Encounter: Payer: Self-pay | Admitting: *Deleted

## 2018-11-15 ENCOUNTER — Other Ambulatory Visit: Payer: Self-pay

## 2018-11-15 ENCOUNTER — Encounter: Payer: Self-pay | Admitting: Primary Care

## 2018-11-15 ENCOUNTER — Ambulatory Visit: Payer: BC Managed Care – PPO | Admitting: Primary Care

## 2018-11-15 VITALS — BP 112/72 | HR 73 | Temp 97.9°F | Ht 68.5 in | Wt 170.8 lb

## 2018-11-15 DIAGNOSIS — R1010 Upper abdominal pain, unspecified: Secondary | ICD-10-CM | POA: Diagnosis not present

## 2018-11-15 LAB — COMPREHENSIVE METABOLIC PANEL
ALT: 13 U/L (ref 0–53)
AST: 13 U/L (ref 0–37)
Albumin: 4.4 g/dL (ref 3.5–5.2)
Alkaline Phosphatase: 77 U/L (ref 39–117)
BUN: 13 mg/dL (ref 6–23)
CO2: 28 mEq/L (ref 19–32)
Calcium: 9.6 mg/dL (ref 8.4–10.5)
Chloride: 103 mEq/L (ref 96–112)
Creatinine, Ser: 0.81 mg/dL (ref 0.40–1.50)
GFR: 109.44 mL/min (ref >60.00)
Glucose, Bld: 98 mg/dL (ref 70–99)
Potassium: 3.9 mEq/L (ref 3.5–5.1)
Sodium: 138 mEq/L (ref 135–145)
Total Bilirubin: 0.3 mg/dL (ref 0.2–1.2)
Total Protein: 6.6 g/dL (ref 6.0–8.3)

## 2018-11-15 LAB — CBC
HCT: 43.5 % (ref 39.0–52.0)
Hemoglobin: 14.4 g/dL (ref 13.0–17.0)
MCHC: 33.1 g/dL (ref 30.0–36.0)
MCV: 89.1 fl (ref 78.0–100.0)
Platelets: 284 10*3/uL (ref 150.0–400.0)
RBC: 4.88 Mil/uL (ref 4.22–5.81)
RDW: 12.8 % (ref 11.5–15.5)
WBC: 6.7 10*3/uL (ref 4.0–10.5)

## 2018-11-15 LAB — LIPASE: Lipase: 20 U/L (ref 11.0–59.0)

## 2018-11-15 LAB — H. PYLORI ANTIBODY, IGG: H Pylori IgG: NEGATIVE

## 2018-11-15 MED ORDER — OMEPRAZOLE 40 MG PO CPDR: 40.0000 mg | DELAYED_RELEASE_CAPSULE | Freq: Every day | ORAL | 0 refills | Status: DC

## 2018-11-15 NOTE — Patient Instructions (Signed)
Stop by the lab prior to leaving today. I will notify you of your results once received.   Start omeprazole 40 mg tablets daily for potential silent heartburn.   Please update me in 1-2 weeks regarding your symptoms.  It was a pleasure to see you today!

## 2018-11-15 NOTE — Progress Notes (Signed)
Subjective:    Patient ID: Steven Reid, male    DOB: [birth year], 33 y.o.   MRN: [id number]  HPI  Mr. Kratzke is a 33 year old male with a history of chronic back pain who presents today with a chief complaint of abdominal pain.  Over the last five days he's noticed mostly epigastric abdominal pain, sometimes to the left and right with tenderness upon palpation. He describes his pain as "deep/churning pain" that has been intermittent for the last several months that will last for a few hours with subsequent soreness for the next several days. His pain does not necessarily correlate with eating.   Around May 2020 he was eating, had difficulty swallowing, choked and had to dislodge the food. Over the next several weeks he noticed continued difficulty swallowing so he saw ENT and speech pathology. He passed his barium swallow study and was referred to GI for potential GERD.   He denies diarrhea, constipation, bloody stools, vomiting, esophageal burning, belching. He does endorse being under increased stress with Covid-19, family stress, work stress. He does have an appointment with GI in late October 2020, was referred here as he cannot get in sooner.   He's taken extra strength Gas-X, Pepcid AC without improvement.   Review of Systems  Constitutional: Negative for fever and unexpected weight change.  Gastrointestinal: Positive for abdominal pain and nausea. Negative for constipation, diarrhea and vomiting.       Past Medical History:  Diagnosis Date  . Chicken pox   . Migraines      Social History   Socioeconomic History  . Marital status: Married    Spouse name: Not on file  . Number of children: Not on file  . Years of education: Not on file  . Highest education level: Not on file  Occupational History  . Not on file  Social Needs  . Financial resource strain: Not on file  . Food insecurity    Worry: Not on file    Inability: Not on file  . Transportation needs   Medical: Not on file    Non-medical: Not on file  Tobacco Use  . Smoking status: Never Smoker  . Smokeless tobacco: Never Used  Substance and Sexual Activity  . Alcohol use: No    Alcohol/week: 0.0 standard drinks  . Drug use: No  . Sexual activity: Not on file  Lifestyle  . Physical activity    Days per week: Not on file    Minutes per session: Not on file  . Stress: Not on file  Relationships  . Social Musician on phone: Not on file    Gets together: Not on file    Attends religious service: Not on file    Active member of club or organization: Not on file    Attends meetings of clubs or organizations: Not on file    Relationship status: Not on file  . Intimate partner violence    Fear of current or ex partner: Not on file    Emotionally abused: Not on file    Physically abused: Not on file    Forced sexual activity: Not on file  Other Topics Concern  . Not on file  Social History Narrative   Married.   No children.   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   Bachelors degree in Dentist.   Enjoys traveling, spending time at home, exercising.    No past surgical history on file.  Family  History  Problem Relation Age of Onset  . Hypertension Father     No Known Allergies  No current outpatient medications on file prior to visit.   No current facility-administered medications on file prior to visit.     BP 112/72   Pulse 73   Temp 97.9 F (36.6 C) (Temporal)   Ht 5' 8.5" (1.74 m)   Wt 170 lb 12 oz (77.5 kg)   SpO2 98%   BMI 25.58 kg/m    Objective:   Physical Exam  Constitutional: He appears well-nourished.  Neck: Neck supple.  Cardiovascular: Normal rate and regular rhythm.  Respiratory: Effort normal.  GI: Soft. Bowel sounds are normal. There is no abdominal tenderness.  Skin: Skin is warm and dry.  Psychiatric: He has a normal mood and affect.           Assessment & Plan:

## 2018-11-15 NOTE — Assessment & Plan Note (Signed)
Chronic and intermittent for months, appears mostly to epigastric region.   Differentials include silent GERD, H pylori, PUD, IBS. Less likely cholecystitis but will keep on list.  Check labs today including CBC, CMP, Lipase, H pylori. Rx for omeprazole 40 mg sent to pharmacy to start daily.  Consider ultrasound if labs without obvious cause and symptoms do not improve with PPI. He will update.

## 2018-11-17 DIAGNOSIS — R1010 Upper abdominal pain, unspecified: Secondary | ICD-10-CM

## 2018-11-21 ENCOUNTER — Ambulatory Visit
Admission: RE | Admit: 2018-11-21 | Discharge: 2018-11-21 | Disposition: A | Discharge status: Home / Self Care | Payer: BC Managed Care – PPO | Source: Ambulatory Visit | Attending: Primary Care | Admitting: Primary Care

## 2018-11-21 DIAGNOSIS — R1010 Upper abdominal pain, unspecified: Secondary | ICD-10-CM

## 2018-11-21 DIAGNOSIS — R109 Unspecified abdominal pain: Secondary | ICD-10-CM | POA: Diagnosis not present

## 2018-12-08 ENCOUNTER — Other Ambulatory Visit: Payer: Self-pay | Admitting: Primary Care

## 2018-12-08 DIAGNOSIS — R1010 Upper abdominal pain, unspecified: Secondary | ICD-10-CM

## 2018-12-16 ENCOUNTER — Other Ambulatory Visit: Payer: Self-pay

## 2018-12-16 ENCOUNTER — Ambulatory Visit (INDEPENDENT_AMBULATORY_CARE_PROVIDER_SITE_OTHER): Payer: BC Managed Care – PPO | Admitting: Gastroenterology

## 2018-12-16 ENCOUNTER — Encounter: Payer: Self-pay | Admitting: Gastroenterology

## 2018-12-16 VITALS — BP 110/71 | HR 64 | Temp 97.1°F | Ht 68.5 in | Wt 169.4 lb

## 2018-12-16 DIAGNOSIS — K2100 Gastro-esophageal reflux disease with esophagitis, without bleeding: Secondary | ICD-10-CM

## 2018-12-16 DIAGNOSIS — R1013 Epigastric pain: Secondary | ICD-10-CM | POA: Diagnosis not present

## 2018-12-16 DIAGNOSIS — R1310 Dysphagia, unspecified: Secondary | ICD-10-CM

## 2018-12-16 DIAGNOSIS — G8929 Other chronic pain: Secondary | ICD-10-CM | POA: Diagnosis not present

## 2018-12-16 NOTE — Progress Notes (Signed)
Gastroenterology Consultation  Referring Provider:     Doreene Nest, NP Primary Care Physician:  Doreene Nest, NP Primary Gastroenterologist:  Dr. Servando Snare     Reason for Consultation:     Dysphagia and epigastric pain.        HPI:   Steven Reid is a 33 y.o. y/o male referred for consultation & management of dysphagia and abdominal pain by Dr. Chestine Spore, Keane Scrape, NP.  This patient comes in today with a history of having an episode of dysphagia where he felt that he could not get the food down and he reported it feel like reverse peristalsis and food being regurgitated up.  The patient states that he was also having some epigastric pain.  The patient has a long history of chronic back pain from weightlifting when he was younger.  The patient now reports that the abdominal pain can happen very intermittently and the last time he had it was a week ago where it lasted 12 hours.  It is not exacerbated by food.  He also states that he was put on omeprazole and reports that he had pain for more than half the time he was on the omeprazole so he stopped it.  There is no report of any black stools or bloody stools.  The patient also denies any family history of colon cancer or colon polyps.  There is no report of any unexplained weight loss.  Past Medical History:  Diagnosis Date   Chicken pox    Migraines     History reviewed. No pertinent surgical history.  Prior to Admission medications   Medication Sig Start Date End Date Taking? Authorizing Provider  omeprazole (PRILOSEC) 40 MG capsule TAKE 1 CAPSULE BY MOUTH EVERY DAY 12/09/18  Yes Doreene Nest, NP    Family History  Problem Relation Age of Onset   Hypertension Father      Social History   Tobacco Use   Smoking status: Never Smoker   Smokeless tobacco: Never Used  Substance Use Topics   Alcohol use: No    Alcohol/week: 0.0 standard drinks   Drug use: No    Allergies as of 12/16/2018   (No Known  Allergies)    Review of Systems:    All systems reviewed and negative except where noted in HPI.   Physical Exam:  BP 110/71    Pulse 64    Temp (!) 97.1 F (36.2 C) (Temporal)    Ht 5' 8.5" (1.74 m)    Wt 169 lb 6.4 oz (76.8 kg)    BMI 25.38 kg/m  No LMP for male patient. General:   Alert,  Well-developed, well-nourished, pleasant and cooperative in NAD Head:  Normocephalic and atraumatic. Eyes:  Sclera clear, no icterus.   Conjunctiva pink. Ears:  Normal auditory acuity. Nose:  No deformity, discharge, or lesions. Mouth:  No deformity or lesions,oropharynx pink & moist. Neck:  Supple; no masses or thyromegaly. Lungs:  Respirations even and unlabored.  Clear throughout to auscultation.   No wheezes, crackles, or rhonchi. No acute distress. Heart:  Regular rate and rhythm; no murmurs, clicks, rubs, or gallops. Abdomen:  Normal bowel sounds.  No bruits.  Soft, non-tender and non-distended without masses, hepatosplenomegaly or hernias noted.  No guarding or rebound tenderness.  Negative Carnett sign.   Rectal:  Deferred.  Msk:  Symmetrical without gross deformities.  Good, equal movement & strength bilaterally. Pulses:  Normal pulses noted. Extremities:  No clubbing or edema.  No cyanosis. Neurologic:  Alert and oriented x3;  grossly normal neurologically. Skin:  Intact without significant lesions or rashes.  No jaundice. Lymph Nodes:  No significant cervical adenopathy. Psych:  Alert and cooperative. Normal mood and affect.  Imaging Studies: US Abdomen Complete  Result Date: 11/21/2018 CLINICAL DATA:  Abdominal pain EXAM: ABDOMEN ULTRASOUND COMPLETE COMPARISON:  None. FINDINGS: Gallbladder: No gallstones or wall thickening visualized. There is no pericholecystic fluid. No sonographic Murphy sign noted by sonographer. Common bile duct: Diameter: 4 mm. No intrahepatic, common hepatic, or common bile duct dilatation. Liver: No focal lesion identified. Within normal limits in parenchymal  echogenicity. Portal vein is patent on color Doppler imaging with normal direction of blood flow towards the liver. IVC: No abnormality visualized. Pancreas: No pancreatic mass or inflammatory focus. Spleen: Size and appearance within normal limits. Right Kidney: Length: 11.5 cm. Echogenicity within normal limits. No mass or hydronephrosis visualized. Left Kidney: Length: 11.4 cm. Echogenicity within normal limits. No mass or hydronephrosis visualized. Abdominal aorta: No aneurysm visualized. Other findings: No demonstrable ascites. IMPRESSION: Study within normal limits. Electronically Signed   By: Lowella Grip III M.D.   On: 11/21/2018 13:32    Assessment and Plan:   Steven Reid is a 33 y.o. y/o male who comes in today with a history of dysphagia with abdominal pain.  The patient has not found anything that makes the symptoms any better or worse and in fact he has tried to eat and it sometimes makes it better and other times it will make it worse.  There is no worry symptoms such as black stools bloody stools or unexplained weight loss.  The patient has had to bring food up after it has gotten stuck.  The patient had a negative ultrasound and H. pylori test.  The patient will be set up for an EGD to look for eosinophilic esophagitis.  The patient has been explained the plan and agrees with it.  This visit consisted of 40 minutes face to face contact with myself and at least 50% of this time was spent in counseling and education regarding diagnosis, treatment options, medication management, risks and benefits of treatment.  Lucilla Lame, MD. Marval Regal    Note: This dictation was prepared with Dragon dictation along with smaller phrase technology. Any transcriptional errors that result from this process are unintentional.

## 2018-12-17 ENCOUNTER — Telehealth: Payer: Self-pay | Admitting: Gastroenterology

## 2018-12-17 NOTE — Telephone Encounter (Signed)
Pt notified we have no control over the price of the procedure. Procedure has been cancelled.

## 2018-12-17 NOTE — Telephone Encounter (Signed)
Pt is calling to cancel his procedure unless we can reduce the amount for him to pay$2600  He said he can go elsewhere for cheaper.

## 2018-12-20 ENCOUNTER — Other Ambulatory Visit: Admission: RE | Admit: 2018-12-20 | Payer: BC Managed Care – PPO | Source: Ambulatory Visit

## 2018-12-24 ENCOUNTER — Encounter: Admission: RE | Payer: Self-pay | Source: Home / Self Care

## 2018-12-24 ENCOUNTER — Ambulatory Visit
Admission: RE | Admit: 2018-12-24 | Payer: BC Managed Care – PPO | Source: Home / Self Care | Admitting: Gastroenterology

## 2018-12-24 SURGERY — ESOPHAGOGASTRODUODENOSCOPY (EGD) WITH PROPOFOL
Anesthesia: General

## 2019-01-02 DIAGNOSIS — R1010 Upper abdominal pain, unspecified: Secondary | ICD-10-CM | POA: Diagnosis not present

## 2019-01-02 DIAGNOSIS — R1310 Dysphagia, unspecified: Secondary | ICD-10-CM | POA: Diagnosis not present

## 2019-01-27 DIAGNOSIS — Z1159 Encounter for screening for other viral diseases: Secondary | ICD-10-CM | POA: Diagnosis not present

## 2019-01-30 DIAGNOSIS — K200 Eosinophilic esophagitis: Secondary | ICD-10-CM | POA: Diagnosis not present

## 2019-01-30 DIAGNOSIS — K2970 Gastritis, unspecified, without bleeding: Secondary | ICD-10-CM | POA: Diagnosis not present

## 2019-01-30 DIAGNOSIS — R1010 Upper abdominal pain, unspecified: Secondary | ICD-10-CM | POA: Diagnosis not present

## 2019-01-30 DIAGNOSIS — K2930 Chronic superficial gastritis without bleeding: Secondary | ICD-10-CM | POA: Diagnosis not present

## 2019-01-30 DIAGNOSIS — K228 Other specified diseases of esophagus: Secondary | ICD-10-CM | POA: Diagnosis not present

## 2019-01-30 DIAGNOSIS — R1310 Dysphagia, unspecified: Secondary | ICD-10-CM | POA: Diagnosis not present

## 2019-04-04 DIAGNOSIS — Z23 Encounter for immunization: Secondary | ICD-10-CM | POA: Diagnosis not present

## 2019-10-25 DIAGNOSIS — M48061 Spinal stenosis, lumbar region without neurogenic claudication: Secondary | ICD-10-CM | POA: Diagnosis not present

## 2019-10-25 DIAGNOSIS — I878 Other specified disorders of veins: Secondary | ICD-10-CM | POA: Diagnosis not present

## 2019-10-25 DIAGNOSIS — M545 Low back pain: Secondary | ICD-10-CM | POA: Diagnosis not present

## 2019-10-25 DIAGNOSIS — M47816 Spondylosis without myelopathy or radiculopathy, lumbar region: Secondary | ICD-10-CM | POA: Diagnosis not present

## 2019-12-16 DIAGNOSIS — M47817 Spondylosis without myelopathy or radiculopathy, lumbosacral region: Secondary | ICD-10-CM | POA: Diagnosis not present

## 2019-12-16 DIAGNOSIS — M5136 Other intervertebral disc degeneration, lumbar region: Secondary | ICD-10-CM | POA: Diagnosis not present

## 2019-12-16 DIAGNOSIS — M5137 Other intervertebral disc degeneration, lumbosacral region: Secondary | ICD-10-CM | POA: Diagnosis not present

## 2019-12-16 DIAGNOSIS — M47816 Spondylosis without myelopathy or radiculopathy, lumbar region: Secondary | ICD-10-CM | POA: Diagnosis not present

## 2020-08-10 DIAGNOSIS — K200 Eosinophilic esophagitis: Secondary | ICD-10-CM | POA: Diagnosis not present

## 2020-09-20 IMAGING — US US ABDOMEN COMPLETE
1 series · 14 of 25 positions shown · non-contrast
Comparison: None.

CLINICAL DATA: Abdominal pain

EXAM:
ABDOMEN ULTRASOUND COMPLETE

[Series 1: us abdomen complete · 0.17mm/px · 14 of 91 slices shown]
[im 1/91]
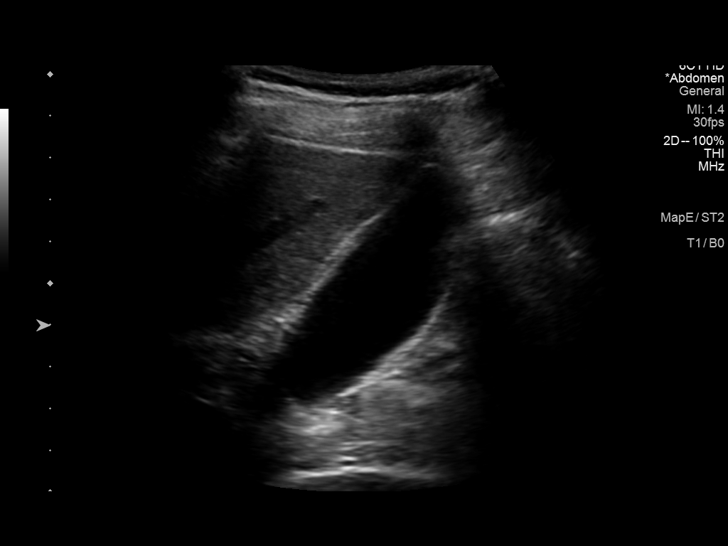
[im 8/91]
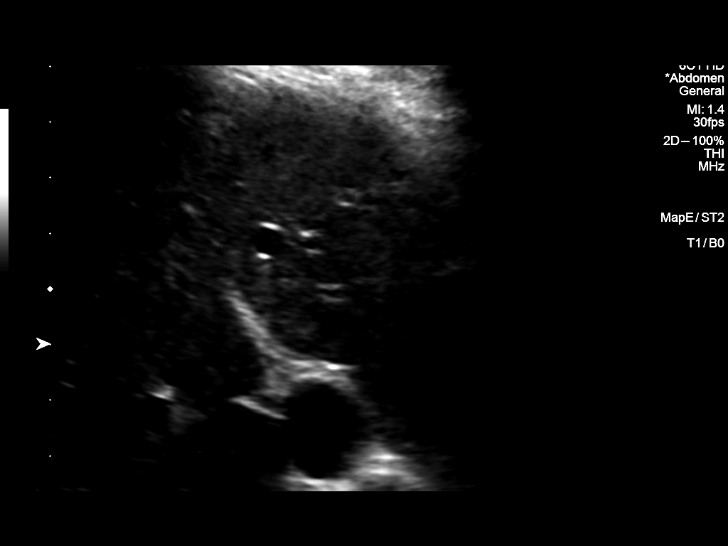
[im 16/91]
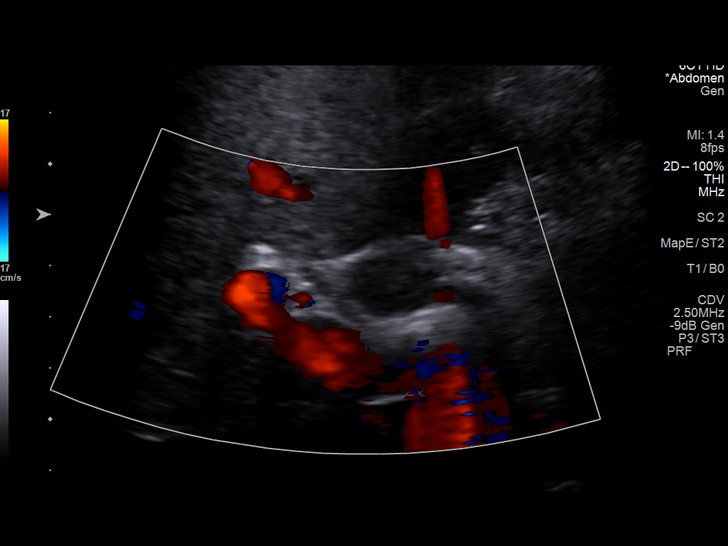
[im 23/91]
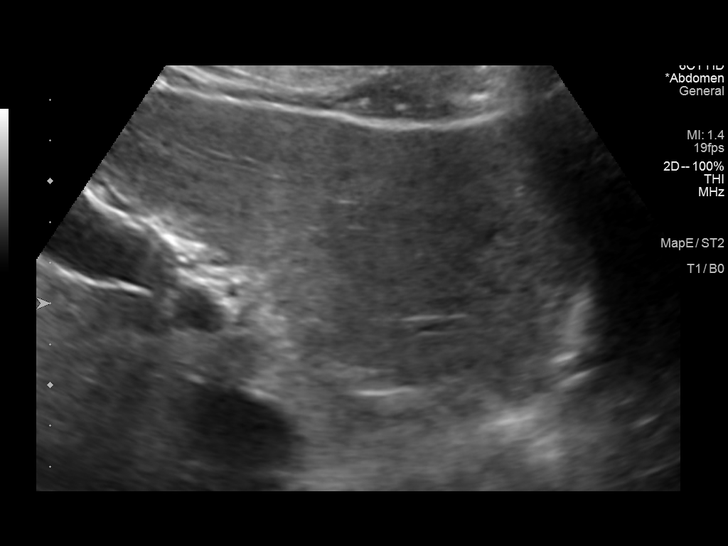
[im 31/91]
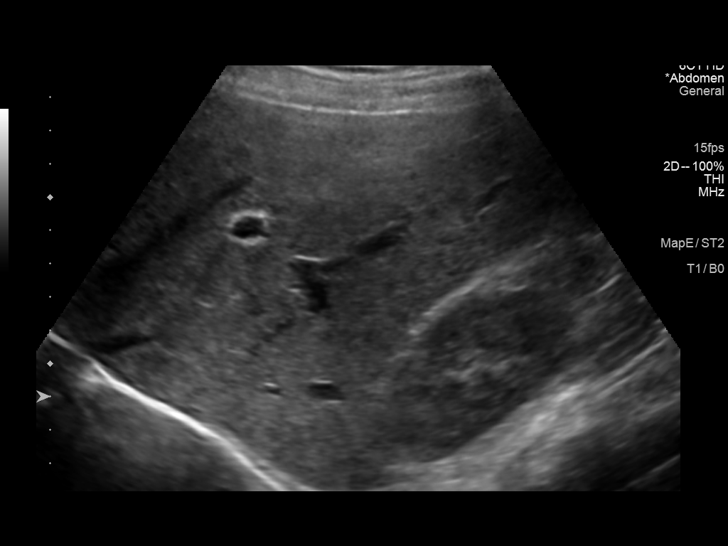
[im 34/91]
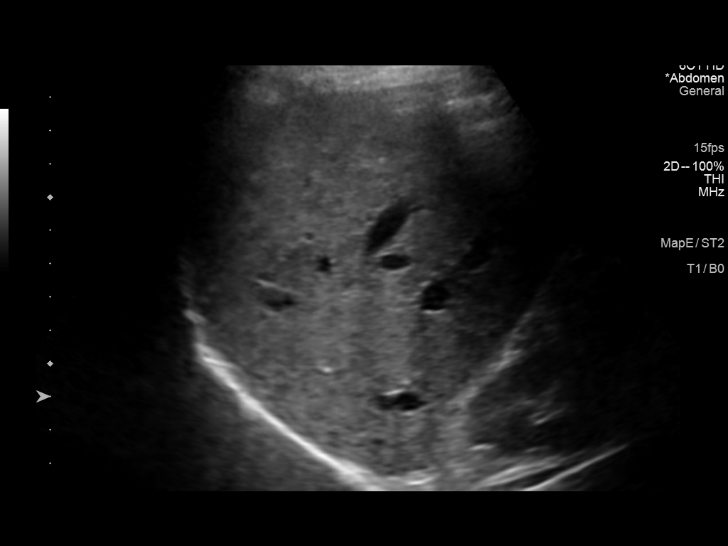
[im 42/91]
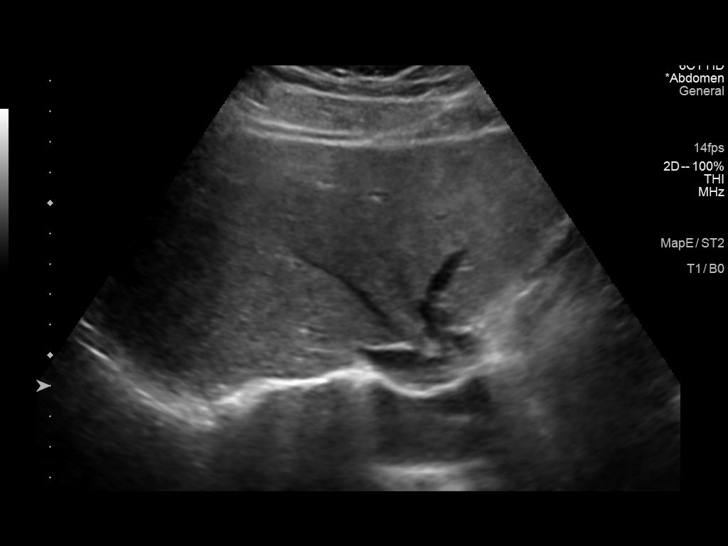
[im 49/91]
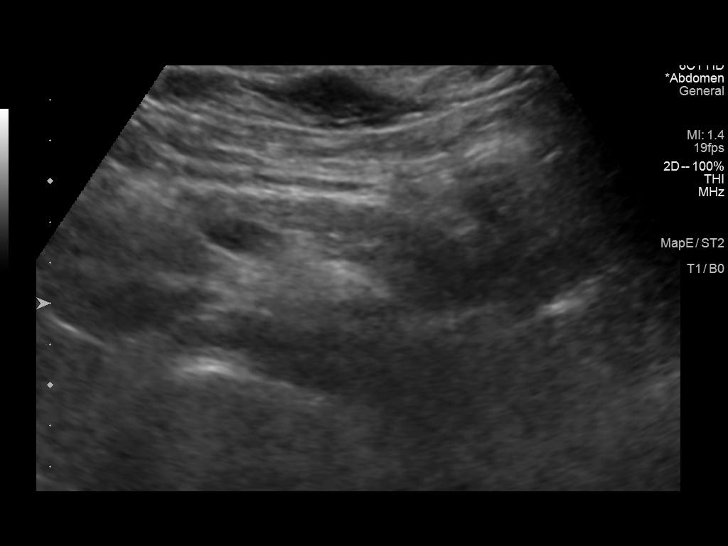
[im 57/91]
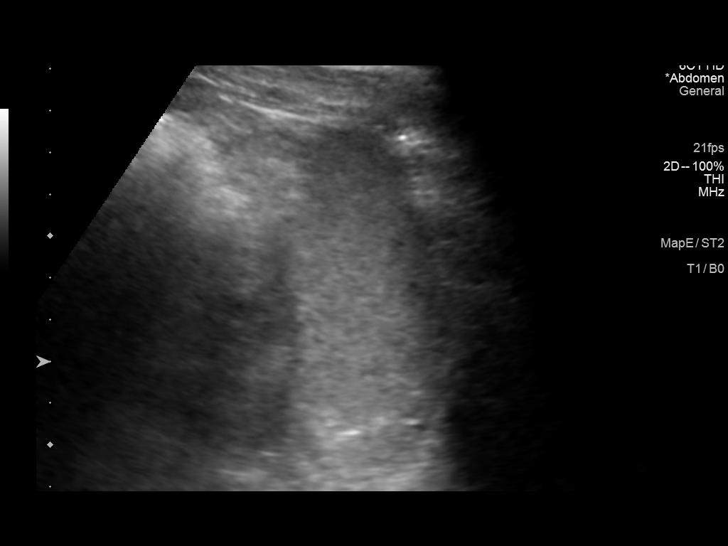
[im 61/91]
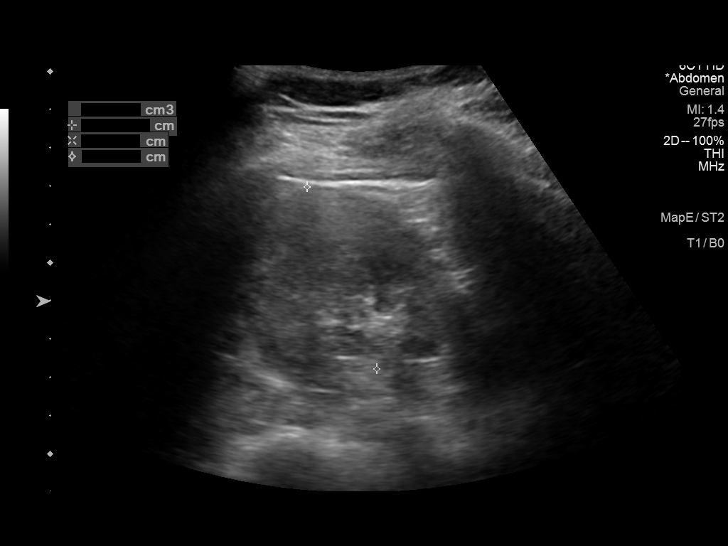
[im 68/91]
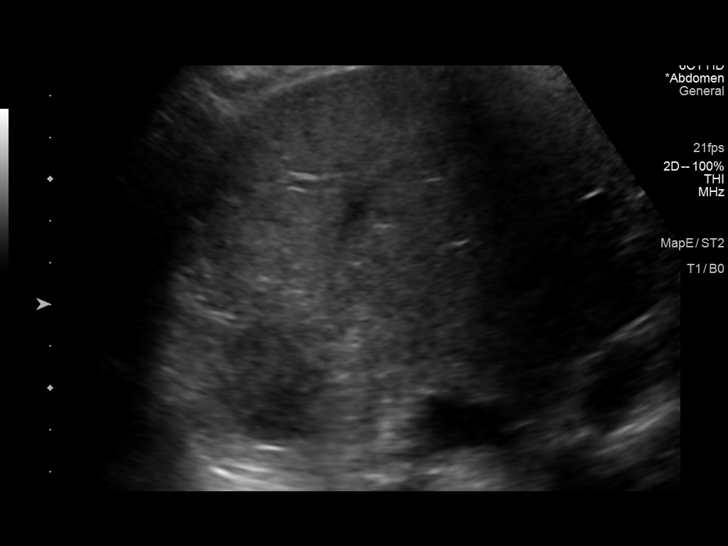
[im 76/91]
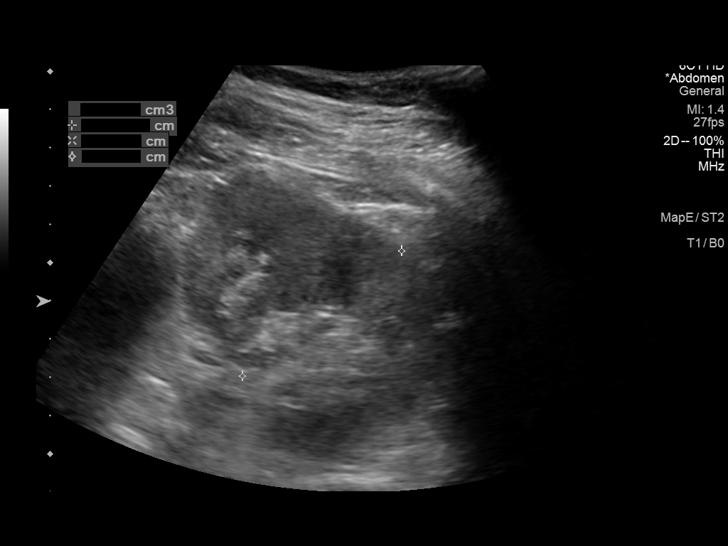
[im 83/91]
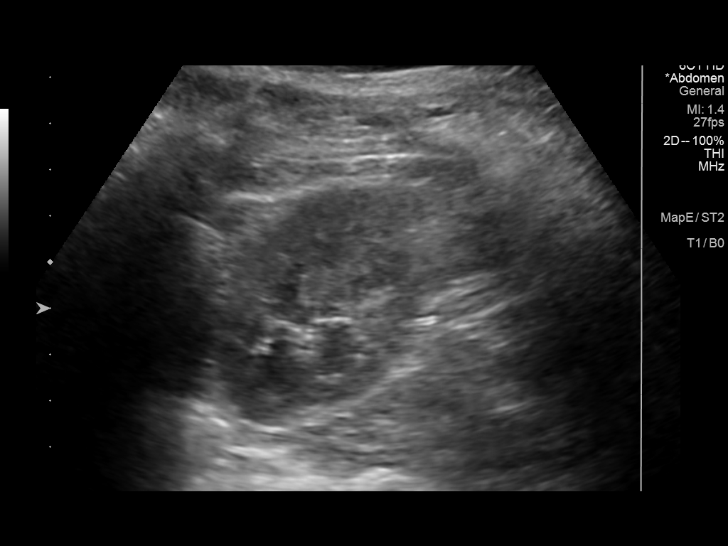
[im 91/91]
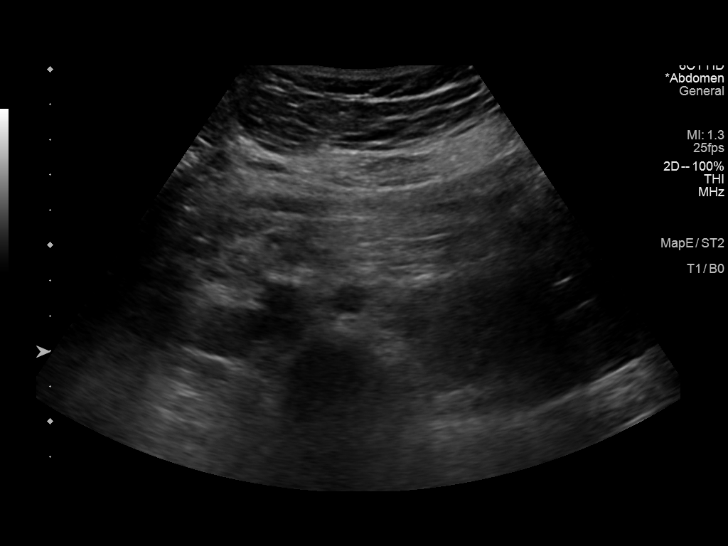

[14 of 25 positions shown; findings below may reference images not displayed]

FINDINGS: Gallbladder: No gallstones or wall thickening visualized. There is
no pericholecystic fluid. No sonographic Murphy sign noted by
sonographer.

Common bile duct: Diameter: 4 mm. No intrahepatic, common hepatic,
or common bile duct dilatation.

Liver: No focal lesion identified. Within normal limits in
parenchymal echogenicity. Portal vein is patent on color Doppler
imaging with normal direction of blood flow towards the liver.

IVC: No abnormality visualized.

Pancreas: No pancreatic mass or inflammatory focus.

Spleen: Size and appearance within normal limits.

Right Kidney: Length: 11.5 cm. Echogenicity within normal limits. No
mass or hydronephrosis visualized.

Left Kidney: Length: 11.4 cm. Echogenicity within normal limits. No
mass or hydronephrosis visualized.

Abdominal aorta: No aneurysm visualized.

Other findings: No demonstrable ascites.
IMPRESSION: Study within normal limits.

## 2020-11-09 DIAGNOSIS — K200 Eosinophilic esophagitis: Secondary | ICD-10-CM | POA: Diagnosis not present

## 2021-09-22 ENCOUNTER — Ambulatory Visit (INDEPENDENT_AMBULATORY_CARE_PROVIDER_SITE_OTHER): Payer: BC Managed Care – PPO | Admitting: Primary Care

## 2021-09-22 ENCOUNTER — Encounter: Payer: Self-pay | Admitting: Primary Care

## 2021-09-22 VITALS — BP 120/74 | HR 86 | Temp 98.0°F | Ht 68.5 in | Wt 174.0 lb

## 2021-09-22 DIAGNOSIS — K200 Eosinophilic esophagitis: Secondary | ICD-10-CM

## 2021-09-22 DIAGNOSIS — M545 Low back pain, unspecified: Secondary | ICD-10-CM

## 2021-09-22 DIAGNOSIS — Z114 Encounter for screening for human immunodeficiency virus [HIV]: Secondary | ICD-10-CM

## 2021-09-22 DIAGNOSIS — Z0000 Encounter for general adult medical examination without abnormal findings: Secondary | ICD-10-CM

## 2021-09-22 DIAGNOSIS — G8929 Other chronic pain: Secondary | ICD-10-CM

## 2021-09-22 DIAGNOSIS — Z1159 Encounter for screening for other viral diseases: Secondary | ICD-10-CM

## 2021-09-22 LAB — COMPREHENSIVE METABOLIC PANEL
ALT: 16 U/L (ref 0–53)
AST: 17 U/L (ref 0–37)
Albumin: 4.7 g/dL (ref 3.5–5.2)
Alkaline Phosphatase: 73 U/L (ref 39–117)
BUN: 11 mg/dL (ref 6–23)
CO2: 29 mEq/L (ref 19–32)
Calcium: 9.8 mg/dL (ref 8.4–10.5)
Chloride: 103 mEq/L (ref 96–112)
Creatinine, Ser: 0.83 mg/dL (ref 0.40–1.50)
GFR: 112.75 mL/min (ref >60.00)
Glucose, Bld: 97 mg/dL (ref 70–99)
Potassium: 4.1 mEq/L (ref 3.5–5.1)
Sodium: 138 mEq/L (ref 135–145)
Total Bilirubin: 0.5 mg/dL (ref 0.2–1.2)
Total Protein: 7.1 g/dL (ref 6.0–8.3)

## 2021-09-22 LAB — LIPID PANEL
Cholesterol: 162 mg/dL (ref 0–200)
HDL: 56.4 mg/dL (ref >39.00)
LDL Cholesterol: 94 mg/dL (ref 0–99)
NonHDL: 105.36
Total CHOL/HDL Ratio: 3
Triglycerides: 55 mg/dL (ref 0.0–149.0)
VLDL: 11 mg/dL (ref 0.0–40.0)

## 2021-09-22 LAB — CBC
HCT: 41.7 % (ref 39.0–52.0)
Hemoglobin: 14 g/dL (ref 13.0–17.0)
MCHC: 33.7 g/dL (ref 30.0–36.0)
MCV: 87.9 fl (ref 78.0–100.0)
Platelets: 289 10*3/uL (ref 150.0–400.0)
RBC: 4.75 Mil/uL (ref 4.22–5.81)
RDW: 13.3 % (ref 11.5–15.5)
WBC: 6.2 10*3/uL (ref 4.0–10.5)

## 2021-09-22 LAB — HEMOGLOBIN A1C: Hgb A1c MFr Bld: 5.7 % (ref 4.6–6.5)

## 2021-09-22 LAB — TESTOSTERONE: Testosterone: 383.89 ng/dL (ref 300.00–890.00)

## 2021-09-22 NOTE — Patient Instructions (Signed)
Stop by the lab prior to leaving today. I will notify you of your results once received.   Try the antifungal powder or cream if the rash returns.  It was a pleasure to see you today!  Preventive Care 57-36 Years Old, Male Preventive care refers to lifestyle choices and visits with your health care provider that can promote health and wellness. Preventive care visits are also called wellness exams. What can I expect for my preventive care visit? Counseling During your preventive care visit, your health care provider may ask about your: Medical history, including: Past medical problems. Family medical history. Current health, including: Emotional well-being. Home life and relationship well-being. Sexual activity. Lifestyle, including: Alcohol, nicotine or tobacco, and drug use. Access to firearms. Diet, exercise, and sleep habits. Safety issues such as seatbelt and bike helmet use. Sunscreen use. Work and work Astronomer. Physical exam Your health care provider may check your: Height and weight. These may be used to calculate your BMI (body mass index). BMI is a measurement that tells if you are at a healthy weight. Waist circumference. This measures the distance around your waistline. This measurement also tells if you are at a healthy weight and may help predict your risk of certain diseases, such as type 2 diabetes and high blood pressure. Heart rate and blood pressure. Body temperature. Skin for abnormal spots. What immunizations do I need?  Vaccines are usually given at various ages, according to a schedule. Your health care provider will recommend vaccines for you based on your age, medical history, and lifestyle or other factors, such as travel or where you work. What tests do I need? Screening Your health care provider may recommend screening tests for certain conditions. This may include: Lipid and cholesterol levels. Diabetes screening. This is done by checking your  blood sugar (glucose) after you have not eaten for a while (fasting). Hepatitis B test. Hepatitis C test. HIV (human immunodeficiency virus) test. STI (sexually transmitted infection) testing, if you are at risk. Talk with your health care provider about your test results, treatment options, and if necessary, the need for more tests. Follow these instructions at home: Eating and drinking  Eat a healthy diet that includes fresh fruits and vegetables, whole grains, lean protein, and low-fat dairy products. Drink enough fluid to keep your urine pale yellow. Take vitamin and mineral supplements as recommended by your health care provider. Do not drink alcohol if your health care provider tells you not to drink. If you drink alcohol: Limit how much you have to 0-2 drinks a day. Know how much alcohol is in your drink. In the [place] one drink equals one 12 oz bottle of beer (355 mL), one 5 oz glass of wine (148 mL), or one 1 oz glass of hard liquor (44 mL). Lifestyle Brush your teeth every morning and night with fluoride toothpaste. Floss one time each day. Exercise for at least 30 minutes 5 or more days each week. Do not use any products that contain nicotine or tobacco. These products include cigarettes, chewing tobacco, and vaping devices, such as e-cigarettes. If you need help quitting, ask your health care provider. Do not use drugs. If you are sexually active, practice safe sex. Use a condom or other form of protection to prevent STIs. Find healthy ways to manage stress, such as: Meditation, yoga, or listening to music. Journaling. Talking to a trusted person. Spending time with friends and family. Minimize exposure to UV radiation to reduce your risk of skin cancer.  Safety Always wear your seat belt while driving or riding in a vehicle. Do not drive: If you have been drinking alcohol. Do not ride with someone who has been drinking. If you have been using any mind-altering substances  or drugs. While texting. When you are tired or distracted. Wear a helmet and other protective equipment during sports activities. If you have firearms in your house, make sure you follow all gun safety procedures. Seek help if you have been physically or sexually abused. What's next? Go to your health care provider once a year for an annual wellness visit. Ask your health care provider how often you should have your eyes and teeth checked. Stay up to date on all vaccines. This information is not intended to replace advice given to you by your health care provider. Make sure you discuss any questions you have with your health care provider. Document Revised: 08/04/2020 Document Reviewed: 08/04/2020 Elsevier Patient Education  2023 [employer].

## 2021-09-22 NOTE — Assessment & Plan Note (Signed)
Immunizations UTD.  Discussed the importance of a healthy diet and regular exercise in order for weight loss, and to reduce the risk of further co-morbidity.  Exam stable. Labs pending.  Follow up in 1 year for repeat physical.  

## 2021-09-22 NOTE — Assessment & Plan Note (Signed)
Diagnosed in [place]. Following with [facility] in [place]. No routine meds.  Continue PRN famotidine.

## 2021-09-22 NOTE — Progress Notes (Signed)
Subjective:    Patient ID: Steven Reid, male    DOB: Jan 17, 1986, 36 y.o.   MRN: [id number]  HPI  Steven Reid is a very pleasant 36 y.o. male who presents today for complete physical and follow up of chronic conditions.  Immunizations: -Tetanus: 2017 -Influenza: Due this season  -Covid-19: Completed 4 vaccines   Diet: Fair diet.  Exercise: Regular exercise.  Eye exam: Completed years ago. No concerns.   Dental exam: Completes semi-annually   BP Readings from Last 3 Encounters:  09/22/21 120/74  12/16/18 110/71  11/15/18 112/72     Review of Systems  Constitutional:  Negative for unexpected weight change.  HENT:  Negative for rhinorrhea.   Respiratory:  Negative for cough and shortness of breath.   Cardiovascular:  Negative for chest pain.  Gastrointestinal:  Negative for constipation and diarrhea.  Genitourinary:  Negative for difficulty urinating.  Musculoskeletal:  Negative for arthralgias and myalgias.  Skin:  Negative for rash.  Allergic/Immunologic: Negative for environmental allergies.  Neurological:  Negative for dizziness and headaches.  Psychiatric/Behavioral:  The patient is not nervous/anxious.          Past Medical History:  Diagnosis Date   Chicken pox    Eosinophilic esophagitis    Migraines     Social History   Socioeconomic History   Marital status: Married    Spouse name: Not on file   Number of children: Not on file   Years of education: Not on file   Highest education level: Not on file  Occupational History   Not on file  Tobacco Use   Smoking status: Never   Smokeless tobacco: Never  Substance and Sexual Activity   Alcohol use: No    Alcohol/week: 0.0 standard drinks of alcohol   Drug use: No   Sexual activity: Not on file  Other Topics Concern   Not on file  Social History Narrative   Married.   No children.   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   Bachelors degree in Dentist.   Enjoys traveling, spending time at  home, exercising.   Social Determinants of Health   Financial Resource Strain: Not on file  Food Insecurity: Not on file  Transportation Needs: Not on file  Physical Activity: Not on file  Stress: Not on file  Social Connections: Not on file  Intimate Partner Violence: Not on file    History reviewed. No pertinent surgical history.  Family History  Problem Relation Age of Onset   Hypertension Father     No Known Allergies  No current outpatient medications on file prior to visit.   No current facility-administered medications on file prior to visit.    BP 120/74   Pulse 86   Temp 98 F (36.7 C) (Oral)   Ht 5' 8.5" (1.74 m)   Wt 174 lb (78.9 kg)   SpO2 98%   BMI 26.07 kg/m  Objective:   Physical Exam HENT:     Right Ear: Tympanic membrane and ear canal normal.     Left Ear: Tympanic membrane and ear canal normal.     Nose: Nose normal.     Right Sinus: No maxillary sinus tenderness or frontal sinus tenderness.     Left Sinus: No maxillary sinus tenderness or frontal sinus tenderness.  Eyes:     Conjunctiva/sclera: Conjunctivae normal.  Neck:     Thyroid: No thyromegaly.     Vascular: No carotid bruit.  Cardiovascular:     Rate  and Rhythm: Normal rate and regular rhythm.     Heart sounds: Normal heart sounds.  Pulmonary:     Effort: Pulmonary effort is normal.     Breath sounds: Normal breath sounds. No wheezing or rales.  Abdominal:     General: Bowel sounds are normal.     Palpations: Abdomen is soft.     Tenderness: There is no abdominal tenderness.  Musculoskeletal:        General: Normal range of motion.     Cervical back: Neck supple.  Skin:    General: Skin is warm and dry.  Neurological:     Mental Status: He is alert and oriented to person, place, and time.     Cranial Nerves: No cranial nerve deficit.     Deep Tendon Reflexes: Reflexes are normal and symmetric.  Psychiatric:        Mood and Affect: Mood normal.           Assessment  & Plan:   Problem List Items Addressed This Visit       Digestive   Eosinophilic esophagitis    Diagnosed in [place]. Following with [facility] in [place]. No routine meds.  Continue PRN famotidine.        Other   Preventative health care - Primary    Immunizations UTD.  Discussed the importance of a healthy diet and regular exercise in order for weight loss, and to reduce the risk of further co-morbidity.  Exam stable. Labs pending.  Follow up in 1 year for repeat physical.       Relevant Orders   Lipid panel   Hemoglobin A1c   Comprehensive metabolic panel   CBC   Testosterone   Chronic right-sided low back pain without sciatica    Stable.  No concerns today. Continue to monitor.       Other Visit Diagnoses     Screening for HIV (human immunodeficiency virus)       Relevant Orders   HIV antibody (with reflex)   Encounter for hepatitis C screening test for low risk patient       Relevant Orders   Hepatitis C Antibody          Doreene Nest, NP

## 2021-09-22 NOTE — Assessment & Plan Note (Signed)
Stable.  No concerns today. Continue to monitor.  

## 2021-09-23 LAB — HIV ANTIBODY (ROUTINE TESTING W REFLEX): HIV 1&2 Ab, 4th Generation: NONREACTIVE

## 2021-09-23 LAB — HEPATITIS C ANTIBODY: Hepatitis C Ab: NONREACTIVE

## 2021-11-15 DIAGNOSIS — L7211 Pilar cyst: Secondary | ICD-10-CM | POA: Diagnosis not present

## 2021-11-15 DIAGNOSIS — R208 Other disturbances of skin sensation: Secondary | ICD-10-CM | POA: Diagnosis not present

## 2021-11-15 DIAGNOSIS — L538 Other specified erythematous conditions: Secondary | ICD-10-CM | POA: Diagnosis not present

## 2022-03-14 ENCOUNTER — Encounter: Payer: BC Managed Care – PPO | Admitting: Family Medicine

## 2022-03-14 ENCOUNTER — Telehealth: Payer: BC Managed Care – PPO | Admitting: Family Medicine

## 2022-03-14 DIAGNOSIS — R1010 Upper abdominal pain, unspecified: Secondary | ICD-10-CM

## 2022-03-14 NOTE — Progress Notes (Signed)
Duplicate

## 2022-03-14 NOTE — Progress Notes (Unsigned)
   Is going to follow up with GI for esinophilic esophagitis and acute abdominal pain.  Patient acknowledged agreement and understanding of the plan.

## 2022-09-26 ENCOUNTER — Encounter: Payer: BC Managed Care – PPO | Admitting: Primary Care

## 2022-10-03 ENCOUNTER — Encounter: Payer: BC Managed Care – PPO | Admitting: Primary Care

## 2022-10-05 ENCOUNTER — Encounter (INDEPENDENT_AMBULATORY_CARE_PROVIDER_SITE_OTHER): Payer: Self-pay

## 2022-10-31 ENCOUNTER — Encounter: Payer: Self-pay | Admitting: Primary Care

## 2022-10-31 ENCOUNTER — Ambulatory Visit (INDEPENDENT_AMBULATORY_CARE_PROVIDER_SITE_OTHER): Payer: BC Managed Care – PPO | Admitting: Primary Care

## 2022-10-31 VITALS — BP 118/76 | HR 50 | Temp 98.0°F | Ht 68.5 in | Wt 165.0 lb

## 2022-10-31 DIAGNOSIS — Z0000 Encounter for general adult medical examination without abnormal findings: Secondary | ICD-10-CM | POA: Diagnosis not present

## 2022-10-31 DIAGNOSIS — G8929 Other chronic pain: Secondary | ICD-10-CM | POA: Diagnosis not present

## 2022-10-31 DIAGNOSIS — M545 Low back pain, unspecified: Secondary | ICD-10-CM | POA: Diagnosis not present

## 2022-10-31 DIAGNOSIS — R7303 Prediabetes: Secondary | ICD-10-CM | POA: Insufficient documentation

## 2022-10-31 NOTE — Assessment & Plan Note (Signed)
Noted on labs from last year. Commended him on weight loss!  Repeat A1C pending.

## 2022-10-31 NOTE — Patient Instructions (Signed)
Schedule a lab only appointment for before 10 am.  It was a pleasure to see you today!

## 2022-10-31 NOTE — Progress Notes (Signed)
Subjective:    Patient ID: Steven Reid, male    DOB: 1985-04-14, 37 y.o.   MRN: [id number]  HPI  Steven Reid is a very pleasant 37 y.o. male who presents today for complete physical and follow up of chronic conditions.  Immunizations: -Tetanus: Completed in 2017 -Influenza: Declines   Diet: Improved diet. Exercise: Exercising regularly   Eye exam: Completed years ago. Dental exam: Completes semi-annually    BP Readings from Last 3 Encounters:  10/31/22 118/76  09/22/21 120/74  12/16/18 110/71   Wt Readings from Last 3 Encounters:  10/31/22 165 lb (74.8 kg)  09/22/21 174 lb (78.9 kg)  12/16/18 169 lb 6.4 oz (76.8 kg)       Review of Systems  Constitutional:  Negative for unexpected weight change.  HENT:  Negative for rhinorrhea.   Respiratory:  Negative for cough and shortness of breath.   Cardiovascular:  Negative for chest pain.  Gastrointestinal:  Negative for constipation and diarrhea.  Genitourinary:  Negative for difficulty urinating.  Musculoskeletal:  Negative for arthralgias and myalgias.  Skin:  Negative for rash.  Allergic/Immunologic: Negative for environmental allergies.  Neurological:  Negative for dizziness and headaches.  Psychiatric/Behavioral:  The patient is not nervous/anxious.          Past Medical History:  Diagnosis Date   Chicken pox    Eosinophilic esophagitis    Migraines     Social History   Socioeconomic History   Marital status: Married    Spouse name: Not on file   Number of children: Not on file   Years of education: Not on file   Highest education level: Associate degree: occupational, Scientist, product/process development, or vocational program  Occupational History   Not on file  Tobacco Use   Smoking status: Never   Smokeless tobacco: Never  Substance and Sexual Activity   Alcohol use: No    Alcohol/week: 0.0 standard drinks of alcohol   Drug use: No   Sexual activity: Not on file  Other Topics Concern   Not on file  Social  History Narrative   Married.   No children.   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   Bachelors degree in Dentist.   Enjoys traveling, spending time at home, exercising.   Social Determinants of Health   Financial Resource Strain: Low Risk  (10/30/2022)   Overall Financial Resource Strain (CARDIA)    Difficulty of Paying Living Expenses: Not hard at all  Food Insecurity: No Food Insecurity (10/30/2022)   Hunger Vital Sign    Worried About Running Out of Food in the Last Year: Never true    Ran Out of Food in the Last Year: Never true  Transportation Needs: No Transportation Needs (10/30/2022)   PRAPARE - Administrator, Civil Service (Medical): No    Lack of Transportation (Non-Medical): No  Physical Activity: Sufficiently Active (10/30/2022)   Exercise Vital Sign    Days of Exercise per Week: 5 days    Minutes of Exercise per Session: 40 min  Stress: No Stress Concern Present (10/30/2022)   [employer] of Occupational Health - Occupational Stress Questionnaire    Feeling of Stress : Not at all  Social Connections: Unknown (10/30/2022)   Social Connection and Isolation Panel [NHANES]    Frequency of Communication with Friends and Family: Once a week    Frequency of Social Gatherings with Friends and Family: Patient declined    Attends Religious Services: Never    Production manager of  Clubs or Organizations: No    Attends Banker Meetings: Not on file    Marital Status: Married  Intimate Partner Violence: Not on file    History reviewed. No pertinent surgical history.  Family History  Problem Relation Age of Onset   Hypertension Father     No Known Allergies  No current outpatient medications on file prior to visit.   No current facility-administered medications on file prior to visit.    BP 118/76   Pulse (!) 50   Temp 98 F (36.7 C) (Temporal)   Ht 5' 8.5" (1.74 m)   Wt 165 lb (74.8 kg)   SpO2 99%   BMI 24.72 kg/m  Objective:   Physical  Exam HENT:     Right Ear: Tympanic membrane and ear canal normal.     Left Ear: Tympanic membrane and ear canal normal.     Nose: Nose normal.     Right Sinus: No maxillary sinus tenderness or frontal sinus tenderness.     Left Sinus: No maxillary sinus tenderness or frontal sinus tenderness.  Eyes:     Conjunctiva/sclera: Conjunctivae normal.  Neck:     Thyroid: No thyromegaly.     Vascular: No carotid bruit.  Cardiovascular:     Rate and Rhythm: Normal rate and regular rhythm.     Heart sounds: Normal heart sounds.  Pulmonary:     Effort: Pulmonary effort is normal.     Breath sounds: Normal breath sounds. No wheezing or rales.  Abdominal:     General: Bowel sounds are normal.     Palpations: Abdomen is soft.     Tenderness: There is no abdominal tenderness.  Musculoskeletal:        General: Normal range of motion.     Cervical back: Neck supple.  Skin:    General: Skin is warm and dry.  Neurological:     Mental Status: He is alert and oriented to person, place, and time.     Cranial Nerves: No cranial nerve deficit.     Deep Tendon Reflexes: Reflexes are normal and symmetric.  Psychiatric:        Mood and Affect: Mood normal.           Assessment & Plan:  Preventative health care Assessment & Plan: Immunizations UTD. Declines influenza vaccine.  Commended him on weight loss.  Exam stable. Labs pending.  Follow up in 1 year for repeat physical.   Orders: -     Lipid panel; Future -     Hemoglobin A1c; Future -     Comprehensive metabolic panel; Future -     CBC; Future -     Testosterone; Future  Chronic right-sided low back pain without sciatica Assessment & Plan: Controlled.  No concerns today. Continue to monitor.   Prediabetes Assessment & Plan: Noted on labs from last year. Commended him on weight loss!  Repeat A1C pending.  Orders: -     Lipid panel; Future -     Hemoglobin A1c; Future -     Comprehensive metabolic panel; Future -      CBC; Future -     Testosterone; Future        Doreene Nest, NP

## 2022-10-31 NOTE — Assessment & Plan Note (Signed)
Controlled.  No concerns today. Continue to monitor.

## 2022-10-31 NOTE — Assessment & Plan Note (Signed)
Immunizations UTD. Declines influenza vaccine.  Commended him on weight loss.  Exam stable. Labs pending.  Follow up in 1 year for repeat physical.

## 2022-11-07 ENCOUNTER — Other Ambulatory Visit: Payer: BC Managed Care – PPO

## 2022-11-09 ENCOUNTER — Ambulatory Visit: Payer: BC Managed Care – PPO | Admitting: Family

## 2022-11-09 ENCOUNTER — Telehealth: Payer: Self-pay | Admitting: Primary Care

## 2022-11-09 ENCOUNTER — Encounter: Payer: Self-pay | Admitting: Family

## 2022-11-09 VITALS — BP 120/86 | HR 99 | Temp 97.9°F | Ht 68.5 in | Wt 162.4 lb

## 2022-11-09 DIAGNOSIS — U071 COVID-19: Secondary | ICD-10-CM | POA: Diagnosis not present

## 2022-11-09 HISTORY — DX: COVID-19: U07.1

## 2022-11-09 MED ORDER — MAGIC MOUTHWASH W/LIDOCAINE: 5.0000 mL | Freq: Three times a day (TID) | ORAL | 0 refills | Status: AC | PRN

## 2022-11-09 MED ORDER — PREDNISONE 10 MG PO TABS: ORAL_TABLET | ORAL | 0 refills | Status: DC

## 2022-11-09 NOTE — Telephone Encounter (Signed)
,  FYI: This call has been transferred to Access Nurse. Once the result note has been entered staff can address the message at that time.  Patient called in with the following symptoms:  Red Word: swollen tongue    Please advise at Mobile [phone] (mobile)  Message is routed to Provider Pool and [facility] Triage    Pt called stating he tested pos for covid on last Sun, 9/15. Pt states since then he's been having throat pain & recently developed a swollen tongue. Pt also mentioned having white ulcers in the back of his throat, pt described the ulcers to feel like blisters. No other symptoms. No available slot at our office, scheduled pt with Rennie Plowman, NP at [facility] for today, 9/19 @ 11:00 am. Transferred pt to access nurse. Routing message to triage pool & Rennie Plowman.

## 2022-11-09 NOTE — Assessment & Plan Note (Addendum)
Point-of-care strep is negative.  Patient is managing secretions.  No acute respiratory distress or compromise of airway. presentation is not consistent with tonsillar abscess. Patient has been on steroids before.  Provided him with prednisone taper and counseled on side effects and how to take medication.  Also provided him with Magic mouthwash.  If posterior pharynx pustule seen on exam does not resolve on its own, advised patient to let me know.

## 2022-11-09 NOTE — Progress Notes (Signed)
Assessment & Plan:  COVID-19 Assessment & Plan: Point-of-care strep is negative.  Patient is managing secretions.  No acute respiratory distress or compromise of airway. presentation is not consistent with tonsillar abscess. Patient has been on steroids before.  Provided him with prednisone taper and counseled on side effects and how to take medication.  Also provided him with Magic mouthwash.  If posterior pharynx pustule seen on exam does not resolve on its own, advised patient to let me know.   Orders: -     magic mouthwash w/lidocaine; Take 5 mLs by mouth 3 (three) times daily as needed for up to 10 days for mouth pain.  Dispense: 150 mL; Refill: 0 -     predniSONE; Take 4 tablets ( total 40 mg) by mouth for 2 days; take 3 tablets ( total 30 mg) by mouth for 2 days; take 2 tablets ( total 20 mg) by mouth for 1 day; take 1 tablet ( total 10 mg) by mouth for 1 day.  Dispense: 17 tablet; Refill: 0     Return precautions given.   Risks, benefits, and alternatives of the medications and treatment plan prescribed today were discussed, and patient expressed understanding.   Education regarding symptom management and diagnosis given to patient on AVS either electronically or printed.  No follow-ups on file.  Rennie Plowman, FNP  Subjective:    Patient ID: Rolla Etienne, male    DOB: 1985/05/09, 37 y.o.   MRN: [id number]  CC: Dylanjames Huisman is a 37 y.o. male who presents today for an acute visit.    HPI: Complains of bilateral sore throat x 6 days, unchanged.   Describes a 'pustule' posterior throat  He describes that he has 'big tonsils'.   Tongue feels swollen and burnt, unchanged.   Cough is scant. PND and nasal congestion is better 'overall'.   Fatigue has improved.   No fever, sob, drooling, wheezing.    Taking naproxen, sudafed, benadryl, mucinex, without significant relief.    He has been treated with steroids in the past and tolerated them well in the setting of  eosinophilia  esophagitis.           Tested positive for COVID 11/05/2022.  Complains of throat pain and 'swollen tongue' No history of drug allergies.  No history angioedema   Allergies: Patient has no known allergies. No current outpatient medications on file prior to visit.   No current facility-administered medications on file prior to visit.    Review of Systems  Constitutional:  Negative for chills and fever.  Respiratory:  Negative for cough.   Cardiovascular:  Negative for chest pain and palpitations.  Gastrointestinal:  Negative for nausea and vomiting.      Objective:    BP 120/86   Pulse 99   Temp 97.9 F (36.6 C) (Oral)   Ht 5' 8.5" (1.74 m)   Wt 162 lb 6.4 oz (73.7 kg)   SpO2 98%   BMI 24.33 kg/m   BP Readings from Last 3 Encounters:  11/09/22 120/86  10/31/22 118/76  09/22/21 120/74   Wt Readings from Last 3 Encounters:  11/09/22 162 lb 6.4 oz (73.7 kg)  10/31/22 165 lb (74.8 kg)  09/22/21 174 lb (78.9 kg)    Physical Exam Vitals reviewed.  Constitutional:      Appearance: He is well-developed.  HENT:     Head: Normocephalic and atraumatic.     Right Ear: Hearing, tympanic membrane, ear canal and external ear normal. No decreased  hearing noted. No drainage, swelling or tenderness. No middle ear effusion. Tympanic membrane is not injected, erythematous or bulging.     Left Ear: Hearing, tympanic membrane, ear canal and external ear normal. No decreased hearing noted. No drainage, swelling or tenderness.  No middle ear effusion. Tympanic membrane is not injected, erythematous or bulging.     Nose: Nose normal.     Right Sinus: No maxillary sinus tenderness or frontal sinus tenderness.     Left Sinus: No maxillary sinus tenderness or frontal sinus tenderness.     Mouth/Throat:     Pharynx: Uvula midline. Pharyngeal swelling present. No oropharyngeal exudate or posterior oropharyngeal erythema.     Tonsils: No tonsillar abscesses.       Comments: Left posterior pharynx pustule noted < 1cm.  Uvula not edematous and is midline. Tonsils are behind pillars and symmetrically enlarged.  Eyes:     Conjunctiva/sclera: Conjunctivae normal.  Neck:     Trachea: No tracheal deviation.  Cardiovascular:     Rate and Rhythm: Regular rhythm.     Heart sounds: Normal heart sounds.  Pulmonary:     Effort: Pulmonary effort is normal. No respiratory distress.     Breath sounds: Normal breath sounds. No wheezing, rhonchi or rales.  Musculoskeletal:     Cervical back: No rigidity. No pain with movement.  Lymphadenopathy:     Head:     Right side of head: No submental, submandibular, tonsillar, preauricular, posterior auricular or occipital adenopathy.     Left side of head: No submental, submandibular, tonsillar, preauricular, posterior auricular or occipital adenopathy.     Cervical: No cervical adenopathy.  Skin:    General: Skin is warm and dry.  Neurological:     Mental Status: He is alert.  Psychiatric:        Speech: Speech normal.        Behavior: Behavior normal.

## 2022-11-09 NOTE — Telephone Encounter (Signed)
Per chart review tab pt has already seen Phoebe Perch FNP. Sending FYI to Phoebe Perch FNP. Thank you.

## 2022-11-09 NOTE — Patient Instructions (Signed)
Please start Magic mouthwash. I provided you with prednisone taper.  I would advise starting tomorrow and to get all doses in by noon each day.  Prednisone can interfere with sleep. Please let me know if pustule which she notated next to the uvula does not resolve.

## 2022-11-14 ENCOUNTER — Other Ambulatory Visit (INDEPENDENT_AMBULATORY_CARE_PROVIDER_SITE_OTHER): Payer: BC Managed Care – PPO

## 2022-11-14 DIAGNOSIS — Z0000 Encounter for general adult medical examination without abnormal findings: Secondary | ICD-10-CM | POA: Diagnosis not present

## 2022-11-14 DIAGNOSIS — R7303 Prediabetes: Secondary | ICD-10-CM

## 2022-11-14 LAB — LIPID PANEL
Cholesterol: 157 mg/dL (ref 0–200)
HDL: 47.3 mg/dL (ref >39.00)
LDL Cholesterol: 92 mg/dL (ref 0–99)
NonHDL: 109.41
Total CHOL/HDL Ratio: 3
Triglycerides: 89 mg/dL (ref 0.0–149.0)
VLDL: 17.8 mg/dL (ref 0.0–40.0)

## 2022-11-14 LAB — COMPREHENSIVE METABOLIC PANEL
ALT: 30 U/L (ref 0–53)
AST: 17 U/L (ref 0–37)
Albumin: 4.4 g/dL (ref 3.5–5.2)
Alkaline Phosphatase: 74 U/L (ref 39–117)
BUN: 16 mg/dL (ref 6–23)
CO2: 31 mEq/L (ref 19–32)
Calcium: 9.8 mg/dL (ref 8.4–10.5)
Chloride: 102 mEq/L (ref 96–112)
Creatinine, Ser: 0.9 mg/dL (ref 0.40–1.50)
GFR: 109.15 mL/min (ref >60.00)
Glucose, Bld: 96 mg/dL (ref 70–99)
Potassium: 4.1 mEq/L (ref 3.5–5.1)
Sodium: 139 mEq/L (ref 135–145)
Total Bilirubin: 0.6 mg/dL (ref 0.2–1.2)
Total Protein: 6.9 g/dL (ref 6.0–8.3)

## 2022-11-14 LAB — CBC
HCT: 44.9 % (ref 39.0–52.0)
Hemoglobin: 14.9 g/dL (ref 13.0–17.0)
MCHC: 33.1 g/dL (ref 30.0–36.0)
MCV: 87.4 fl (ref 78.0–100.0)
Platelets: 360 10*3/uL (ref 150.0–400.0)
RBC: 5.14 Mil/uL (ref 4.22–5.81)
RDW: 12.8 % (ref 11.5–15.5)
WBC: 8.8 10*3/uL (ref 4.0–10.5)

## 2022-11-14 LAB — HEMOGLOBIN A1C: Hgb A1c MFr Bld: 5.9 % (ref 4.6–6.5)

## 2022-11-14 LAB — TESTOSTERONE: Testosterone: 390.54 ng/dL (ref 300.00–890.00)

## 2022-11-18 DIAGNOSIS — R7303 Prediabetes: Secondary | ICD-10-CM

## 2023-03-13 ENCOUNTER — Other Ambulatory Visit: Payer: BC Managed Care – PPO

## 2023-05-29 DIAGNOSIS — K200 Eosinophilic esophagitis: Secondary | ICD-10-CM | POA: Diagnosis not present

## 2023-10-23 DIAGNOSIS — L905 Scar conditions and fibrosis of skin: Secondary | ICD-10-CM | POA: Diagnosis not present

## 2023-10-23 DIAGNOSIS — D485 Neoplasm of uncertain behavior of skin: Secondary | ICD-10-CM | POA: Diagnosis not present

## 2023-10-23 DIAGNOSIS — R208 Other disturbances of skin sensation: Secondary | ICD-10-CM | POA: Diagnosis not present

## 2023-10-23 DIAGNOSIS — L538 Other specified erythematous conditions: Secondary | ICD-10-CM | POA: Diagnosis not present

## 2023-11-06 ENCOUNTER — Ambulatory Visit: Admitting: Primary Care

## 2023-11-06 ENCOUNTER — Encounter: Admitting: Primary Care

## 2023-11-06 ENCOUNTER — Encounter: Payer: Self-pay | Admitting: Primary Care

## 2023-11-06 VITALS — BP 122/72 | HR 57 | Temp 98.1°F | Ht 68.5 in | Wt 154.0 lb

## 2023-11-06 DIAGNOSIS — G8929 Other chronic pain: Secondary | ICD-10-CM | POA: Diagnosis not present

## 2023-11-06 DIAGNOSIS — R7303 Prediabetes: Secondary | ICD-10-CM | POA: Diagnosis not present

## 2023-11-06 DIAGNOSIS — M545 Low back pain, unspecified: Secondary | ICD-10-CM

## 2023-11-06 DIAGNOSIS — Z0000 Encounter for general adult medical examination without abnormal findings: Secondary | ICD-10-CM

## 2023-11-06 LAB — CBC
HCT: 43.2 % (ref 39.0–52.0)
Hemoglobin: 14.3 g/dL (ref 13.0–17.0)
MCHC: 33 g/dL (ref 30.0–36.0)
MCV: 88.3 fl (ref 78.0–100.0)
Platelets: 291 K/uL (ref 150.0–400.0)
RBC: 4.9 Mil/uL (ref 4.22–5.81)
RDW: 13.2 % (ref 11.5–15.5)
WBC: 5.1 K/uL (ref 4.0–10.5)

## 2023-11-06 LAB — HEMOGLOBIN A1C: Hgb A1c MFr Bld: 5.8 % (ref 4.6–6.5)

## 2023-11-06 LAB — LIPID PANEL
Cholesterol: 169 mg/dL (ref 0–200)
HDL: 66.7 mg/dL (ref >39.00)
LDL Cholesterol: 95 mg/dL (ref 0–99)
NonHDL: 102.22
Total CHOL/HDL Ratio: 3
Triglycerides: 35 mg/dL (ref 0.0–149.0)
VLDL: 7 mg/dL (ref 0.0–40.0)

## 2023-11-06 LAB — VITAMIN D 25 HYDROXY (VIT D DEFICIENCY, FRACTURES): VITD: 21.79 ng/mL — ABNORMAL LOW (ref 30.00–100.00)

## 2023-11-06 NOTE — Assessment & Plan Note (Signed)
 Immunizations UTD. Declines influenza vaccine.   Discussed the importance of a healthy diet and regular exercise in order for weight loss, and to reduce the risk of further co-morbidity.  Exam stable. Labs pending.  Follow up in 1 year for repeat physical.

## 2023-11-06 NOTE — Progress Notes (Signed)
 Subjective:    Patient ID: Steven Reid, male    DOB: [birth year], 38 y.o.   MRN: [id number]  Steven Reid is a very pleasant 38 y.o. male who presents today for complete physical and follow up of chronic conditions.  Immunizations: -Tetanus: Completed in 2017 -Influenza: Declines influenza vaccine.   Diet: Fair diet.  Exercise: Regular exercise.  Eye exam: Completed last in 2017, wears reading glasses. Dental exam: Completes semi-annually    BP Readings from Last 3 Encounters:  11/06/23 122/72  11/09/22 120/86  10/31/22 118/76    Wt Readings from Last 3 Encounters:  11/06/23 154 lb (69.9 kg)  11/09/22 162 lb 6.4 oz (73.7 kg)  10/31/22 165 lb (74.8 kg)       Review of Systems  Constitutional:  Negative for unexpected weight change.  HENT:  Negative for rhinorrhea.   Respiratory:  Negative for cough and shortness of breath.   Cardiovascular:  Negative for chest pain.  Gastrointestinal:  Negative for constipation and diarrhea.  Genitourinary:  Negative for difficulty urinating.  Musculoskeletal:  Negative for arthralgias and myalgias.  Skin:  Negative for rash.  Allergic/Immunologic: Negative for environmental allergies.  Neurological:  Negative for dizziness and headaches.  Psychiatric/Behavioral:  The patient is not nervous/anxious.          Past Medical History:  Diagnosis Date   Chicken pox    COVID-19 11/09/2022   Eosinophilic esophagitis    GERD (gastroesophageal reflux disease) 2020   Migraines     Social History   Socioeconomic History   Marital status: Married    Spouse name: Not on file   Number of children: Not on file   Years of education: Not on file   Highest education level: Associate degree: occupational, Scientist, product/process development, or vocational program  Occupational History   Not on file  Tobacco Use   Smoking status: Never   Smokeless tobacco: Never   Tobacco comments:    None  Substance and Sexual Activity   Alcohol use: No   Drug use:  Never   Sexual activity: Yes    Birth control/protection: Surgical  Other Topics Concern   Not on file  Social History Narrative   Married.   No children.   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   Bachelors degree in Dentist.   Enjoys traveling, spending time at home, exercising.   Social Drivers of Corporate investment banker Strain: Low Risk  (11/05/2023)   Overall Financial Resource Strain (CARDIA)    Difficulty of Paying Living Expenses: Not hard at all  Food Insecurity: No Food Insecurity (11/05/2023)   Hunger Vital Sign    Worried About Running Out of Food in the Last Year: Never true    Ran Out of Food in the Last Year: Never true  Transportation Needs: No Transportation Needs (11/05/2023)   PRAPARE - Administrator, Civil Service (Medical): No    Lack of Transportation (Non-Medical): No  Physical Activity: Sufficiently Active (11/05/2023)   Exercise Vital Sign    Days of Exercise per Week: 5 days    Minutes of Exercise per Session: 60 min  Stress: No Stress Concern Present (11/05/2023)   [employer] of Occupational Health - Occupational Stress Questionnaire    Feeling of Stress: Not at all  Social Connections: Unknown (11/05/2023)   Social Connection and Isolation Panel    Frequency of Communication with Friends and Family: Once a week    Frequency of Social Gatherings with Friends  and Family: Patient declined    Attends Religious Services: Never    Active Member of Clubs or Organizations: No    Attends Engineer, structural: Not on file    Marital Status: Married  Catering manager Violence: Not on file    History reviewed. No pertinent surgical history.  Family History  Problem Relation Age of Onset   Arthritis Mother    Hypertension Father     No Known Allergies  No current outpatient medications on file prior to visit.   No current facility-administered medications on file prior to visit.    BP 122/72   Pulse (!) 57   Temp  98.1 F (36.7 C) (Oral)   Ht 5' 8.5 (1.74 m)   Wt 154 lb (69.9 kg)   SpO2 98%   BMI 23.08 kg/m  Objective:   Physical Exam HENT:     Right Ear: Tympanic membrane and ear canal normal.     Left Ear: Tympanic membrane and ear canal normal.  Eyes:     Pupils: Pupils are equal, round, and reactive to light.  Cardiovascular:     Rate and Rhythm: Normal rate and regular rhythm.  Pulmonary:     Effort: Pulmonary effort is normal.     Breath sounds: Normal breath sounds.  Abdominal:     General: Bowel sounds are normal.     Palpations: Abdomen is soft.     Tenderness: There is no abdominal tenderness.  Musculoskeletal:        General: Normal range of motion.     Cervical back: Neck supple.  Skin:    General: Skin is warm and dry.  Neurological:     Mental Status: He is alert and oriented to person, place, and time.     Cranial Nerves: No cranial nerve deficit.     Deep Tendon Reflexes:     Reflex Scores:      Patellar reflexes are 2+ on the right side and 2+ on the left side. Psychiatric:        Mood and Affect: Mood normal.     Physical Exam        Assessment & Plan:  Preventative health care Assessment & Plan: Immunizations UTD. Declines influenza vaccine.  Discussed the importance of a healthy diet and regular exercise in order for weight loss, and to reduce the risk of further co-morbidity.  Exam stable. Labs pending.  Follow up in 1 year for repeat physical.   Orders: -     Lipid panel -     Hemoglobin A1c -     VITAMIN D  25 Hydroxy (Vit-D Deficiency, Fractures) -     Comprehensive metabolic panel with GFR -     CBC  Prediabetes Assessment & Plan: Manage him on weight loss and regular exercise. Repeat A1c pending.  Orders: -     Lipid panel -     Hemoglobin A1c -     VITAMIN D  25 Hydroxy (Vit-D Deficiency, Fractures) -     Comprehensive metabolic panel with GFR -     CBC  Chronic right-sided low back pain without sciatica Assessment &  Plan: No concerns today. Continue to monitor.     Assessment and Plan Assessment & Plan         Comer MARLA Gaskins, NP    History of Present Illness

## 2023-11-06 NOTE — Assessment & Plan Note (Signed)
 No concerns today. Continue to monitor.

## 2023-11-06 NOTE — Assessment & Plan Note (Signed)
 Manage him on weight loss and regular exercise. Repeat A1c pending.

## 2023-11-08 ENCOUNTER — Ambulatory Visit: Payer: Self-pay | Admitting: Primary Care

## 2023-11-08 DIAGNOSIS — E559 Vitamin D deficiency, unspecified: Secondary | ICD-10-CM

## 2023-11-08 DIAGNOSIS — R7303 Prediabetes: Secondary | ICD-10-CM

## 2023-11-10 LAB — COMPREHENSIVE METABOLIC PANEL WITH GFR
ALT: 13 U/L (ref 0–53)
AST: 15 U/L (ref 0–37)
Albumin: 4.7 g/dL (ref 3.5–5.2)
Alkaline Phosphatase: 64 U/L (ref 39–117)
BUN: 11 mg/dL (ref 6–23)
CO2: 29 meq/L (ref 19–32)
Calcium: 9.9 mg/dL (ref 8.4–10.5)
Chloride: 106 meq/L (ref 96–112)
Creatinine, Ser: 0.87 mg/dL (ref 0.40–1.50)
GFR: 109.52 mL/min (ref >60.00)
Glucose, Bld: 91 mg/dL (ref 70–99)
Potassium: 4 meq/L (ref 3.5–5.1)
Sodium: 138 meq/L (ref 135–145)
Total Bilirubin: 0.7 mg/dL (ref 0.2–1.2)
Total Protein: 7 g/dL (ref 6.0–8.3)

## 2024-01-24 ENCOUNTER — Ambulatory Visit: Payer: Self-pay

## 2024-01-24 NOTE — Telephone Encounter (Signed)
 Noted

## 2024-01-24 NOTE — Telephone Encounter (Signed)
 FYI Only or Action Required?: FYI only for provider: pt will be using [facility], no appts avail at PCP clinic nor regional clinics, pt does not want to wait until tomorrow.  Patient was last seen in primary care on 11/06/2023 by Gretta Comer POUR, NP.  Called Nurse Triage reporting Sinusitis.  Symptoms began a week ago.  Interventions attempted: OTC medications: numerous different OTC medications.  Symptoms are: gradually worsening.  Triage Disposition: See Physician Within 24 Hours  Patient/caregiver understands and will follow disposition?: Yes, will follow disposition  Copied from CRM #[id number]. Topic: Clinical - Red Word Triage >> Jan 24, 2024  9:03 AM Charlet HERO wrote: Red Word that prompted transfer to Nurse Triage: Patient is calling about fluid on his chest, thick phlem dark green, throat is scratchy and swollen, fluid in ear and pain. Comer Gretta. [facility] Reason for Disposition  Earache  Answer Assessment - Initial Assessment Questions 1. LOCATION: Where does it hurt?      Throat and sinuses 2. ONSET: When did the sinus pain start?  (e.g., hours, days)      About a week ago 3. SEVERITY: How bad is the pain?   (Scale 0-10; or none, mild, moderate or severe)     mild 4. RECURRENT SYMPTOM: Have you ever had sinus problems before? If Yes, ask: When was the last time? and What happened that time?      States that has hx of sinuses infections, self diagnosed 5. NASAL CONGESTION: Is the nose blocked? If Yes, ask: Can you open it or must you breathe through your mouth?     stuffy 6. NASAL DISCHARGE: Do you have discharge from your nose? If so ask, What color?     Dark green 7. FEVER: Do you have a fever? If Yes, ask: What is it, how was it measured, and when did it start?      denies 8. OTHER SYMPTOMS: Do you have any other symptoms? (e.g., sore throat, cough, earache, difficulty breathing)     Ear ache, cough, sore throat  Protocols used: Sinus Pain  or Congestion-A-AH

## 2024-01-26 DIAGNOSIS — J019 Acute sinusitis, unspecified: Secondary | ICD-10-CM | POA: Diagnosis not present
